# Patient Record
Sex: Female | Born: 1980 | Race: White | Hispanic: No | Marital: Single | Smoking: Current every day smoker
Health system: Southern US, Community
[De-identification: ages and names within clinical notes are randomized; demographics above are authoritative.]

## PROBLEM LIST (undated history)

## (undated) DIAGNOSIS — F329 Major depressive disorder, single episode, unspecified: Secondary | ICD-10-CM

## (undated) DIAGNOSIS — F32A Depression, unspecified: Secondary | ICD-10-CM

## (undated) DIAGNOSIS — K8020 Calculus of gallbladder without cholecystitis without obstruction: Secondary | ICD-10-CM

## (undated) HISTORY — PX: TONSILLECTOMY: SUR1361

## (undated) HISTORY — PX: TUBAL LIGATION: SHX77

---
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
## 1998-04-24 ENCOUNTER — Encounter: Admission: RE | Admit: 1998-04-24 | Discharge: 1998-04-24 | Payer: Self-pay | Admitting: Family Medicine

## 1998-05-01 ENCOUNTER — Encounter: Admission: RE | Admit: 1998-05-01 | Discharge: 1998-05-01 | Payer: Self-pay | Admitting: Family Medicine

## 1998-05-23 ENCOUNTER — Ambulatory Visit (HOSPITAL_COMMUNITY): Admission: RE | Admit: 1998-05-23 | Discharge: 1998-05-23 | Payer: Self-pay | Admitting: Sports Medicine

## 1998-05-29 ENCOUNTER — Encounter: Admission: RE | Admit: 1998-05-29 | Discharge: 1998-05-29 | Payer: Self-pay | Admitting: Sports Medicine

## 1998-06-29 ENCOUNTER — Encounter: Admission: RE | Admit: 1998-06-29 | Discharge: 1998-06-29 | Payer: Self-pay | Admitting: Family Medicine

## 1998-07-24 ENCOUNTER — Encounter: Admission: RE | Admit: 1998-07-24 | Discharge: 1998-07-24 | Payer: Self-pay | Admitting: Family Medicine

## 1998-08-06 ENCOUNTER — Encounter: Admission: RE | Admit: 1998-08-06 | Discharge: 1998-08-06 | Payer: Self-pay | Admitting: Family Medicine

## 1998-08-23 ENCOUNTER — Encounter: Admission: RE | Admit: 1998-08-23 | Discharge: 1998-08-23 | Payer: Self-pay | Admitting: Family Medicine

## 1998-09-06 ENCOUNTER — Encounter: Admission: RE | Admit: 1998-09-06 | Discharge: 1998-09-06 | Payer: Self-pay | Admitting: Family Medicine

## 1998-09-11 ENCOUNTER — Encounter: Admission: RE | Admit: 1998-09-11 | Discharge: 1998-09-11 | Payer: Self-pay | Admitting: Sports Medicine

## 1998-09-20 ENCOUNTER — Encounter: Admission: RE | Admit: 1998-09-20 | Discharge: 1998-09-20 | Payer: Self-pay | Admitting: Family Medicine

## 1998-09-27 ENCOUNTER — Encounter: Admission: RE | Admit: 1998-09-27 | Discharge: 1998-09-27 | Payer: Self-pay | Admitting: Family Medicine

## 1998-09-29 ENCOUNTER — Inpatient Hospital Stay (HOSPITAL_COMMUNITY): Admission: AD | Admit: 1998-09-29 | Discharge: 1998-10-01 | Payer: Self-pay | Admitting: *Deleted

## 1998-10-04 ENCOUNTER — Encounter: Admission: RE | Admit: 1998-10-04 | Discharge: 1998-10-04 | Payer: Self-pay | Admitting: Family Medicine

## 1998-11-29 ENCOUNTER — Other Ambulatory Visit: Admission: RE | Admit: 1998-11-29 | Discharge: 1998-11-29 | Payer: Self-pay | Admitting: *Deleted

## 1998-12-28 ENCOUNTER — Encounter: Admission: RE | Admit: 1998-12-28 | Discharge: 1998-12-28 | Payer: Self-pay | Admitting: Family Medicine

## 2004-02-06 ENCOUNTER — Encounter: Admission: RE | Admit: 2004-02-06 | Discharge: 2004-02-06 | Payer: Self-pay | Admitting: Family Medicine

## 2004-02-13 ENCOUNTER — Encounter: Admission: RE | Admit: 2004-02-13 | Discharge: 2004-02-13 | Payer: Self-pay | Admitting: Family Medicine

## 2004-02-15 ENCOUNTER — Ambulatory Visit (HOSPITAL_COMMUNITY): Admission: RE | Admit: 2004-02-15 | Discharge: 2004-02-15 | Payer: Self-pay

## 2004-03-12 ENCOUNTER — Encounter: Admission: RE | Admit: 2004-03-12 | Discharge: 2004-03-12 | Payer: Self-pay | Admitting: Family Medicine

## 2004-03-15 ENCOUNTER — Ambulatory Visit (HOSPITAL_COMMUNITY): Admission: RE | Admit: 2004-03-15 | Discharge: 2004-03-15 | Payer: Self-pay | Admitting: Family Medicine

## 2004-03-20 ENCOUNTER — Encounter: Admission: RE | Admit: 2004-03-20 | Discharge: 2004-03-20 | Payer: Self-pay | Admitting: *Deleted

## 2004-03-28 ENCOUNTER — Encounter: Admission: RE | Admit: 2004-03-28 | Discharge: 2004-03-28 | Payer: Self-pay | Admitting: *Deleted

## 2004-04-10 ENCOUNTER — Ambulatory Visit (HOSPITAL_COMMUNITY): Admission: RE | Admit: 2004-04-10 | Discharge: 2004-04-10 | Payer: Self-pay | Admitting: *Deleted

## 2004-04-11 ENCOUNTER — Encounter: Admission: RE | Admit: 2004-04-11 | Discharge: 2004-04-11 | Payer: Self-pay | Admitting: *Deleted

## 2004-04-25 ENCOUNTER — Encounter: Admission: RE | Admit: 2004-04-25 | Discharge: 2004-04-25 | Payer: Self-pay | Admitting: Family Medicine

## 2004-05-08 ENCOUNTER — Ambulatory Visit (HOSPITAL_COMMUNITY): Admission: RE | Admit: 2004-05-08 | Discharge: 2004-05-08 | Payer: Self-pay | Admitting: Obstetrics and Gynecology

## 2004-05-09 ENCOUNTER — Encounter: Admission: RE | Admit: 2004-05-09 | Discharge: 2004-05-09 | Payer: Self-pay | Admitting: Family Medicine

## 2004-05-17 ENCOUNTER — Inpatient Hospital Stay (HOSPITAL_COMMUNITY): Admission: AD | Admit: 2004-05-17 | Discharge: 2004-05-17 | Payer: Self-pay | Admitting: Obstetrics & Gynecology

## 2004-05-23 ENCOUNTER — Encounter: Admission: RE | Admit: 2004-05-23 | Discharge: 2004-05-23 | Payer: Self-pay | Admitting: Family Medicine

## 2004-05-23 ENCOUNTER — Inpatient Hospital Stay (HOSPITAL_COMMUNITY): Admission: AD | Admit: 2004-05-23 | Discharge: 2004-05-23 | Payer: Self-pay | Admitting: *Deleted

## 2004-05-27 ENCOUNTER — Inpatient Hospital Stay (HOSPITAL_COMMUNITY): Admission: AD | Admit: 2004-05-27 | Discharge: 2004-05-30 | Payer: Self-pay | Admitting: *Deleted

## 2009-11-26 ENCOUNTER — Emergency Department: Payer: Self-pay | Admitting: Emergency Medicine

## 2011-01-05 ENCOUNTER — Encounter: Payer: Self-pay | Admitting: *Deleted

## 2011-02-08 ENCOUNTER — Emergency Department (HOSPITAL_COMMUNITY)
Admission: EM | Admit: 2011-02-08 | Discharge: 2011-02-08 | Disposition: A | Discharge status: Home / Self Care | Payer: Self-pay | Attending: Emergency Medicine | Admitting: Emergency Medicine

## 2011-02-08 DIAGNOSIS — T169XXA Foreign body in ear, unspecified ear, initial encounter: Secondary | ICD-10-CM | POA: Insufficient documentation

## 2011-02-08 DIAGNOSIS — IMO0002 Reserved for concepts with insufficient information to code with codable children: Secondary | ICD-10-CM | POA: Insufficient documentation

## 2011-02-08 DIAGNOSIS — H9209 Otalgia, unspecified ear: Secondary | ICD-10-CM | POA: Insufficient documentation

## 2012-06-09 ENCOUNTER — Encounter (HOSPITAL_COMMUNITY): Payer: Self-pay | Admitting: *Deleted

## 2012-06-09 ENCOUNTER — Emergency Department (HOSPITAL_COMMUNITY): Payer: Self-pay

## 2012-06-09 ENCOUNTER — Emergency Department (HOSPITAL_COMMUNITY)
Admission: EM | Admit: 2012-06-09 | Discharge: 2012-06-09 | Disposition: A | Discharge status: Home / Self Care | Payer: Self-pay | Attending: Emergency Medicine | Admitting: Emergency Medicine

## 2012-06-09 DIAGNOSIS — K8020 Calculus of gallbladder without cholecystitis without obstruction: Secondary | ICD-10-CM | POA: Insufficient documentation

## 2012-06-09 DIAGNOSIS — R1011 Right upper quadrant pain: Secondary | ICD-10-CM | POA: Insufficient documentation

## 2012-06-09 DIAGNOSIS — M549 Dorsalgia, unspecified: Secondary | ICD-10-CM | POA: Insufficient documentation

## 2012-06-09 DIAGNOSIS — R0602 Shortness of breath: Secondary | ICD-10-CM | POA: Insufficient documentation

## 2012-06-09 LAB — COMPREHENSIVE METABOLIC PANEL
ALT: 10 U/L (ref 0–35)
AST: 13 U/L (ref 0–37)
Albumin: 4.1 g/dL (ref 3.5–5.2)
Alkaline Phosphatase: 54 U/L (ref 39–117)
CO2: 26 mEq/L (ref 19–32)
Chloride: 102 mEq/L (ref 96–112)
Creatinine, Ser: 0.82 mg/dL (ref 0.50–1.10)
GFR calc non Af Amer: >90 mL/min (ref >90)
Potassium: 4 mEq/L (ref 3.5–5.1)
Sodium: 139 mEq/L (ref 135–145)
Total Bilirubin: 0.3 mg/dL (ref 0.3–1.2)

## 2012-06-09 LAB — CBC
MCV: 94.2 fL (ref 78.0–100.0)
Platelets: 284 10*3/uL (ref 150–400)
RBC: 4.63 MIL/uL (ref 3.87–5.11)
RDW: 13.1 % (ref 11.5–15.5)
WBC: 11 10*3/uL — ABNORMAL HIGH (ref 4.0–10.5)

## 2012-06-09 LAB — URINALYSIS, ROUTINE W REFLEX MICROSCOPIC
Bilirubin Urine: NEGATIVE
Glucose, UA: NEGATIVE mg/dL
Hgb urine dipstick: NEGATIVE
Protein, ur: NEGATIVE mg/dL
Urobilinogen, UA: 1 mg/dL (ref 0.0–1.0)

## 2012-06-09 LAB — POCT PREGNANCY, URINE: Preg Test, Ur: NEGATIVE

## 2012-06-09 LAB — URINE MICROSCOPIC-ADD ON

## 2012-06-09 MED ORDER — ONDANSETRON HCL 4 MG/2ML IJ SOLN
4.0000 mg | Freq: Once | INTRAMUSCULAR | Status: AC
  Administered 2012-06-09: 4 mg via INTRAVENOUS
  Filled 2012-06-09: qty 2

## 2012-06-09 MED ORDER — HYDROCODONE-ACETAMINOPHEN 5-500 MG PO TABS: 1.0000 | ORAL_TABLET | Freq: Four times a day (QID) | ORAL | Status: AC | PRN

## 2012-06-09 MED ORDER — MORPHINE SULFATE 4 MG/ML IJ SOLN
4.0000 mg | Freq: Once | INTRAMUSCULAR | Status: AC
  Administered 2012-06-09: 4 mg via INTRAVENOUS
  Filled 2012-06-09: qty 1

## 2012-06-09 NOTE — ED Provider Notes (Addendum)
History     CSN: [id number]  Arrival date & time 06/09/12  1616   First MD Initiated Contact with Patient 06/09/12 1951      Chief Complaint  Patient presents with  . Abdominal Pain    (Consider location/radiation/quality/duration/timing/severity/associated sxs/prior treatment) Patient is a 31 y.o. female presenting with abdominal pain. The history is provided by the patient.  Abdominal Pain The primary symptoms of the illness include abdominal pain, shortness of breath and nausea. The primary symptoms of the illness do not include fever, vomiting, diarrhea, dysuria, vaginal discharge or vaginal bleeding. Episode onset: 3 days ago. The onset of the illness was gradual. The problem has been gradually worsening.  The abdominal pain is located in the RUQ. The abdominal pain radiates to the back (between shoulder blades and around the ribs). The severity of the abdominal pain is 8/10. The abdominal pain is relieved by nothing. Exacerbated by: certain positions.  The patient states that she believes she is currently not pregnant. The patient has not had a change in bowel habit. Additional symptoms associated with the illness include back pain. Symptoms associated with the illness do not include chills, anorexia, constipation, urgency, hematuria or frequency. Significant associated medical issues do not include PUD, GERD or gallstones.    History reviewed. No pertinent past medical history.  History reviewed. No pertinent past surgical history.  History reviewed. No pertinent family history.  History  Substance Use Topics  . Smoking status: Not on file  . Smokeless tobacco: Not on file  . Alcohol Use: No    OB History    Grav Para Term Preterm Abortions TAB SAB Ect Mult Living                  Review of Systems  Constitutional: Negative for fever and chills.  Respiratory: Positive for shortness of breath.   Gastrointestinal: Positive for nausea and abdominal pain. Negative for  vomiting, diarrhea, constipation and anorexia.  Genitourinary: Negative for dysuria, urgency, frequency, hematuria, vaginal bleeding and vaginal discharge.  Musculoskeletal: Positive for back pain.  All other systems reviewed and are negative.    Allergies  Review of patient's allergies indicates no known allergies.  Home Medications  No current outpatient prescriptions on file.  BP 120/89  Pulse 78  Temp 98.2 F (36.8 C) (Oral)  Resp 20  SpO2 100%  LMP 05/26/2012  Physical Exam  Nursing note and vitals reviewed. Constitutional: She is oriented to person, place, and time. She appears well-developed and well-nourished. No distress.  HENT:  Head: Normocephalic and atraumatic.  Mouth/Throat: Oropharynx is clear and moist.  Eyes: Conjunctivae and EOM are normal. Pupils are equal, round, and reactive to light.  Neck: Normal range of motion. Neck supple.  Cardiovascular: Normal rate, regular rhythm and intact distal pulses.   No murmur heard. Pulmonary/Chest: Effort normal and breath sounds normal. No respiratory distress. She has no wheezes. She has no rales. She exhibits no tenderness.  Abdominal: Soft. She exhibits no distension. There is tenderness in the right upper quadrant. There is guarding and positive Murphy's sign. There is no rebound and no CVA tenderness. No hernia.  Musculoskeletal: Normal range of motion. She exhibits no edema and no tenderness.       Thoracic back: Normal.  Neurological: She is alert and oriented to person, place, and time.  Skin: Skin is warm and dry. No rash noted. No erythema.  Psychiatric: She has a normal mood and affect. Her behavior is normal.  ED Course  Procedures (including critical care time)  Labs Reviewed  CBC - Abnormal; Notable for the following:    WBC 11.0 (*)     All other components within normal limits  URINALYSIS, ROUTINE W REFLEX MICROSCOPIC - Abnormal; Notable for the following:    APPearance CLOUDY (*)      Leukocytes, UA SMALL (*)     All other components within normal limits  URINE MICROSCOPIC-ADD ON - Abnormal; Notable for the following:    Squamous Epithelial / LPF MANY (*)     All other components within normal limits  COMPREHENSIVE METABOLIC PANEL  POCT PREGNANCY, URINE   Dg Chest 2 View  06/09/2012  *RADIOLOGY REPORT*  Clinical Data: Rib pain  CHEST - 2 VIEW  Comparison: None.  Findings: Normal heart size.  Clear lungs.   Epicardial fat at the right pericardiophrenic angle is noted.  No pneumothorax.  No pleural effusion.  IMPRESSION: No active cardiopulmonary disease.  Original Report Authenticated By: Donavan Burnet, M.D.   US Abdomen Complete  06/09/2012  *RADIOLOGY REPORT*  Clinical Data:  Right upper quadrant pain.  COMPLETE ABDOMINAL ULTRASOUND  Comparison:  None.  Findings:  Gallbladder:  The gallbladder is contracted and filled with multiple echogenic stones forming the wall echo shadow complex. This appearance can be associated with acute cholecystitis in the appropriate clinical setting.  However, no obvious gallbladder wall thickening or edema is demonstrated in the Murphy's sign is negative.  Clinical correlation is recommended.  Common bile duct:  Normal caliber with measured diameter of 4.3 mm.  Liver:  No focal lesion identified.  Within normal limits in parenchymal echogenicity.  IVC:  Appears normal.  Pancreas:  The pancreas is mostly obscured by overlying bowel gas. Visualized portions are unremarkable.  Spleen:  Spleen length measures 6.9 cm.  Normal homogeneous parenchymal echotexture.  Right Kidney:  The right kidney measures 10.7 cm length.  No hydronephrosis.  Left Kidney:  The left kidney measures 11.7 cm length.  No hydronephrosis.  Abdominal aorta:  No aneurysm.  IMPRESSION: Contracted and stone filled gallbladder.  Examination is otherwise unremarkable.  Original Report Authenticated By: Marlon Pel, M.D.     1. Gallstone       MDM   Patient with atypical  shoulder blade pain but also pronounced right upper quadrant pain that started 3 days ago and has persistently worsened. She denies any lower abdominal symptoms or urinary tract infection symptoms. She is afebrile here but has a leukocytosis of 11,000 and a UA is contaminated but otherwise no evidence of infection. CMP is within normal limits. Will get U/S of abd to eval gallbladder and CXR to evaluate the ribs and back.  PERC neg and mild cough but no other resp sx.  9:54 PM [place] positive for multiple gallstones.  But pain is controlled and will have her f/u with surgery      Gwyneth Sprout, MD 06/09/12 2154  Gwyneth Sprout, MD 06/09/12 7829  Gwyneth Sprout, MD 06/09/12 2254

## 2012-06-09 NOTE — ED Notes (Signed)
Pt reports pain around abd and into her rib area, having diarrhea, denies vomiting. Denies urinary symptoms.

## 2012-06-09 NOTE — ED Notes (Signed)
Patient with history of abdominal pain since Monday, just under ribcage around body, like a band around her.  Patient states that the pain has been getting worse since Monday.  Patient states that she did have some nausea today with the pain.

## 2012-06-09 NOTE — Discharge Instructions (Signed)
Gallbladder Disease You have gallbladder disease. This means that there are stones and/or inflammation in your gallbladder and bile duct system. The symptoms of this disease are: heartburn, nausea, belching, vomiting, and intolerance to certain foods (fatty foods mainly). Exact diagnosis of this condition requires ultrasound or special x-ray examination. Gallbladder symptoms may improve with a proper low-fat diet and weight loss (if you are overweight). Medicines to relieve pain and reduce spasms in the bile duct may be quite helpful. Usually the diseased gallbladder needs to be removed by surgery.  SEEK IMMEDIATE MEDICAL CARE IF:  You have more severe or persistent pain that lasts for more than one day. The pain would most likely be in the upper right part of the abdomen.   You develop a fever, repeated vomiting, or jaundice (yellow skin and eyes).  Document Released: 01/08/2005 Document Revised: 08/13/2011 Document Reviewed: 10/19/2009 [employer] Patient Information 2012 [employer]. 

## 2012-06-21 ENCOUNTER — Encounter (HOSPITAL_COMMUNITY): Payer: Self-pay | Admitting: *Deleted

## 2012-06-21 ENCOUNTER — Emergency Department (HOSPITAL_COMMUNITY)
Admission: EM | Admit: 2012-06-21 | Discharge: 2012-06-22 | Disposition: A | Discharge status: Home / Self Care | Payer: Self-pay | Attending: Emergency Medicine | Admitting: Emergency Medicine

## 2012-06-21 DIAGNOSIS — R109 Unspecified abdominal pain: Secondary | ICD-10-CM | POA: Insufficient documentation

## 2012-06-21 DIAGNOSIS — K8020 Calculus of gallbladder without cholecystitis without obstruction: Secondary | ICD-10-CM | POA: Insufficient documentation

## 2012-06-21 HISTORY — DX: Calculus of gallbladder without cholecystitis without obstruction: K80.20

## 2012-06-21 LAB — CBC WITH DIFFERENTIAL/PLATELET
Basophils Absolute: 0 10*3/uL (ref 0.0–0.1)
Basophils Relative: 0 % (ref 0–1)
Hemoglobin: 14.1 g/dL (ref 12.0–15.0)
Lymphocytes Relative: 31 % (ref 12–46)
MCHC: 34.6 g/dL (ref 30.0–36.0)
Monocytes Relative: 7 % (ref 3–12)
Neutro Abs: 6.6 10*3/uL (ref 1.7–7.7)
Neutrophils Relative %: 61 % (ref 43–77)
RDW: 12.9 % (ref 11.5–15.5)
WBC: 10.7 10*3/uL — ABNORMAL HIGH (ref 4.0–10.5)

## 2012-06-21 LAB — COMPREHENSIVE METABOLIC PANEL
AST: 14 U/L (ref 0–37)
Albumin: 3.8 g/dL (ref 3.5–5.2)
Alkaline Phosphatase: 64 U/L (ref 39–117)
CO2: 27 mEq/L (ref 19–32)
Chloride: 104 mEq/L (ref 96–112)
GFR calc non Af Amer: >90 mL/min (ref >90)
Potassium: 3.5 mEq/L (ref 3.5–5.1)
Total Bilirubin: 0.2 mg/dL — ABNORMAL LOW (ref 0.3–1.2)

## 2012-06-21 LAB — URINALYSIS, ROUTINE W REFLEX MICROSCOPIC
Bilirubin Urine: NEGATIVE
Glucose, UA: NEGATIVE mg/dL
Hgb urine dipstick: NEGATIVE
Ketones, ur: NEGATIVE mg/dL
pH: 6 (ref 5.0–8.0)

## 2012-06-21 LAB — PREGNANCY, URINE: Preg Test, Ur: NEGATIVE

## 2012-06-21 MED ORDER — ONDANSETRON 4 MG PO TBDP
4.0000 mg | ORAL_TABLET | Freq: Once | ORAL | Status: AC
  Administered 2012-06-21: 4 mg via ORAL
  Filled 2012-06-21: qty 1

## 2012-06-21 MED ORDER — HYDROCODONE-ACETAMINOPHEN 5-325 MG PO TABS: 1.0000 | ORAL_TABLET | Freq: Once | ORAL | Status: DC

## 2012-06-21 MED ORDER — HYDROCODONE-ACETAMINOPHEN 5-325 MG PO TABS
2.0000 | ORAL_TABLET | Freq: Once | ORAL | Status: AC
  Administered 2012-06-21: 2 via ORAL
  Filled 2012-06-21: qty 2

## 2012-06-21 NOTE — ED Provider Notes (Signed)
History     CSN: [id number]  Arrival date & time 06/21/12  1929   First MD Initiated Contact with Patient 06/21/12 2115      Chief Complaint  Patient presents with  . Abdominal Pain    (Consider location/radiation/quality/duration/timing/severity/associated sxs/prior treatment) Patient is a 31 y.o. female presenting with abdominal pain. The history is provided by the patient. No language interpreter was used.  Abdominal Pain The primary symptoms of the illness include abdominal pain and nausea. The primary symptoms of the illness do not include fever, shortness of breath, vomiting, diarrhea or vaginal discharge. The current episode started 13 to 24 hours ago. The onset of the illness was gradual. The problem has been gradually worsening.  The patient has not had a change in bowel habit. Symptoms associated with the illness do not include diaphoresis. Significant associated medical issues include gallstones. Significant associated medical issues do not include GERD, inflammatory bowel disease, diabetes, liver disease, substance abuse, HIV or cardiac disease.   RUQ pain intermittant since 6/26 with u/s showing gallstones but no cholecystitis.  States she is waiting for her medicaid to kick in to have surgery.  Ran out of pain meds yesterday and needs more medication. Has been eating and and drinking ok.  States that she bends and twists at work a lot and thinks that is what made the pain worse.  Nausea but no vomiting.  No other complaints.    Past Medical History  Diagnosis Date  . Gallstones     History reviewed. No pertinent past surgical history.  No family history on file.  History  Substance Use Topics  . Smoking status: Current Everyday Smoker  . Smokeless tobacco: Not on file  . Alcohol Use: No    OB History    Grav Para Term Preterm Abortions TAB SAB Ect Mult Living                  Review of Systems  Constitutional: Negative.  Negative for fever and diaphoresis.    HENT: Negative.   Eyes: Negative.   Respiratory: Negative.  Negative for shortness of breath.   Cardiovascular: Negative.  Negative for leg swelling.  Gastrointestinal: Positive for nausea and abdominal pain. Negative for vomiting, diarrhea and blood in stool.  Genitourinary: Negative for vaginal discharge.  Neurological: Negative.  Negative for dizziness, weakness and light-headedness.  Psychiatric/Behavioral: Negative.   All other systems reviewed and are negative.    Allergies  Review of patient's allergies indicates no known allergies.  Home Medications  No current outpatient prescriptions on file.  BP 103/72  Pulse 85  Temp 98.1 F (36.7 C) (Oral)  Resp 16  Ht 5\' 5"  (1.651 m)  Wt 198 lb (89.812 kg)  BMI 32.95 kg/m2  SpO2 98%  LMP 06/21/2012  Physical Exam  Nursing note and vitals reviewed. Constitutional: She is oriented to person, place, and time. She appears well-developed and well-nourished.  HENT:  Head: Normocephalic and atraumatic.  Eyes: Conjunctivae and EOM are normal. Pupils are equal, round, and reactive to light.  Neck: Normal range of motion. Neck supple.  Cardiovascular: Normal rate.   Pulmonary/Chest: Effort normal.  Abdominal: Soft. Bowel sounds are normal. She exhibits no distension and no mass. There is tenderness. There is no rebound and no guarding.  Musculoskeletal: Normal range of motion. She exhibits no edema and no tenderness.  Neurological: She is alert and oriented to person, place, and time. She has normal reflexes.  Skin: Skin is warm and dry.  Psychiatric: She has a normal mood and affect.    ED Course  Procedures (including critical care time)  Labs Reviewed  CBC WITH DIFFERENTIAL - Abnormal; Notable for the following:    WBC 10.7 (*)     All other components within normal limits  COMPREHENSIVE METABOLIC PANEL - Abnormal; Notable for the following:    Total Bilirubin 0.2 (*)     All other components within normal limits   LIPASE, BLOOD  URINALYSIS, ROUTINE W REFLEX MICROSCOPIC  PREGNANCY, URINE   No results found.   No diagnosis found.    MDM  RUQ pain with + gallstones pending surgery.  Out of pain meds.  No fever, elevated wbc,  liver enzymes or lipase.  Better after pain meds.  RX for vicodin.  Will see surgeon pending medicaid.  Ready for discharge.         Remi Haggard, NP 06/22/12 1218

## 2012-06-21 NOTE — ED Notes (Signed)
She was diagnosed with gallstones less than 2 weeks ago and she ran out of pain pills. She has not been able to follow up with a surgeon  Because she has no insurance.  Some nv.  lmpnow

## 2012-06-22 MED ORDER — HYDROCODONE-ACETAMINOPHEN 5-500 MG PO TABS: 1.0000 | ORAL_TABLET | Freq: Four times a day (QID) | ORAL | Status: AC | PRN

## 2012-06-22 MED ORDER — ONDANSETRON HCL 4 MG PO TABS: 4.0000 mg | ORAL_TABLET | Freq: Four times a day (QID) | ORAL | Status: AC

## 2012-06-22 NOTE — ED Notes (Signed)
The patient is AOx4 and comfortable with her discharge instructions.  The patient has a ride home. 

## 2012-06-24 NOTE — ED Provider Notes (Signed)
Medical screening examination/treatment/procedure(s) were performed by non-physician practitioner and as supervising physician I was immediately available for consultation/collaboration.  Royelle Hinchman K Linker, MD 06/24/12 0705 

## 2012-09-03 ENCOUNTER — Emergency Department (HOSPITAL_COMMUNITY): Payer: Self-pay

## 2012-09-03 ENCOUNTER — Encounter (HOSPITAL_COMMUNITY): Payer: Self-pay | Admitting: Emergency Medicine

## 2012-09-03 ENCOUNTER — Emergency Department (HOSPITAL_COMMUNITY)
Admission: EM | Admit: 2012-09-03 | Discharge: 2012-09-03 | Disposition: A | Discharge status: Home / Self Care | Payer: Self-pay | Attending: Emergency Medicine | Admitting: Emergency Medicine

## 2012-09-03 DIAGNOSIS — F17200 Nicotine dependence, unspecified, uncomplicated: Secondary | ICD-10-CM | POA: Insufficient documentation

## 2012-09-03 DIAGNOSIS — K8020 Calculus of gallbladder without cholecystitis without obstruction: Secondary | ICD-10-CM | POA: Insufficient documentation

## 2012-09-03 DIAGNOSIS — D72829 Elevated white blood cell count, unspecified: Secondary | ICD-10-CM | POA: Insufficient documentation

## 2012-09-03 DIAGNOSIS — R109 Unspecified abdominal pain: Secondary | ICD-10-CM

## 2012-09-03 LAB — CBC WITH DIFFERENTIAL/PLATELET
Hemoglobin: 13.8 g/dL (ref 12.0–15.0)
Lymphocytes Relative: 33 % (ref 12–46)
Lymphs Abs: 3.7 10*3/uL (ref 0.7–4.0)
Neutro Abs: 6.7 10*3/uL (ref 1.7–7.7)
Neutrophils Relative %: 60 % (ref 43–77)
Platelets: 271 10*3/uL (ref 150–400)
RBC: 4.3 MIL/uL (ref 3.87–5.11)
WBC: 11.1 10*3/uL — ABNORMAL HIGH (ref 4.0–10.5)

## 2012-09-03 LAB — URINALYSIS, ROUTINE W REFLEX MICROSCOPIC
Bilirubin Urine: NEGATIVE
Ketones, ur: NEGATIVE mg/dL
Nitrite: NEGATIVE
Protein, ur: NEGATIVE mg/dL
pH: 5.5 (ref 5.0–8.0)

## 2012-09-03 LAB — COMPREHENSIVE METABOLIC PANEL
BUN: 15 mg/dL (ref 6–23)
CO2: 26 mEq/L (ref 19–32)
Calcium: 9.3 mg/dL (ref 8.4–10.5)
Chloride: 103 mEq/L (ref 96–112)
Creatinine, Ser: 0.65 mg/dL (ref 0.50–1.10)
GFR calc non Af Amer: >90 mL/min (ref >90)
Total Bilirubin: 0.3 mg/dL (ref 0.3–1.2)

## 2012-09-03 MED ORDER — FAMOTIDINE 20 MG PO TABS: 20.0000 mg | ORAL_TABLET | Freq: Two times a day (BID) | ORAL | Status: DC

## 2012-09-03 MED ORDER — FAMOTIDINE 20 MG PO TABS
20.0000 mg | ORAL_TABLET | Freq: Once | ORAL | Status: AC
Start: 2012-09-03 — End: 2012-09-03
  Administered 2012-09-03: 20 mg via ORAL
  Filled 2012-09-03: qty 1

## 2012-09-03 MED ORDER — KETOROLAC TROMETHAMINE 60 MG/2ML IM SOLN
60.0000 mg | Freq: Once | INTRAMUSCULAR | Status: AC
  Administered 2012-09-03: 60 mg via INTRAMUSCULAR
  Filled 2012-09-03: qty 2

## 2012-09-03 MED ORDER — HYDROCODONE-ACETAMINOPHEN 5-500 MG PO TABS: 1.0000 | ORAL_TABLET | Freq: Four times a day (QID) | ORAL | Status: DC | PRN

## 2012-09-03 MED ORDER — GI COCKTAIL ~~LOC~~
30.0000 mL | Freq: Once | ORAL | Status: AC
  Administered 2012-09-03: 30 mL via ORAL
  Filled 2012-09-03: qty 30

## 2012-09-03 MED ORDER — ONDANSETRON HCL 4 MG PO TABS: 4.0000 mg | ORAL_TABLET | Freq: Four times a day (QID) | ORAL | Status: DC

## 2012-09-03 MED ORDER — PANTOPRAZOLE SODIUM 20 MG PO TBEC: 20.0000 mg | DELAYED_RELEASE_TABLET | Freq: Every day | ORAL | Status: DC

## 2012-09-03 MED ORDER — POTASSIUM CHLORIDE CRYS ER 20 MEQ PO TBCR
20.0000 meq | EXTENDED_RELEASE_TABLET | Freq: Once | ORAL | Status: AC
  Administered 2012-09-03: 20 meq via ORAL
  Filled 2012-09-03: qty 1

## 2012-09-03 MED ORDER — ONDANSETRON 4 MG PO TBDP
8.0000 mg | ORAL_TABLET | Freq: Once | ORAL | Status: AC
  Administered 2012-09-03: 8 mg via ORAL
  Filled 2012-09-03: qty 2

## 2012-09-03 NOTE — ED Provider Notes (Signed)
History     CSN: [id number]  Arrival date & time 09/03/12  0054   First MD Initiated Contact with Patient 09/03/12 0142      Chief Complaint  Patient presents with  . Abdominal Pain    (Consider location/radiation/quality/duration/timing/severity/associated sxs/prior treatment) HPI Hx per PT. Dx with gallstones a few months ago.  Since then has not seen a Careers adviser and doing OK until tonight.  Epigastric and RUQ pain radiates to her back, started at 11pm, ate dinner around 7pm, salad and pizza.  Took tylenol without relief, having nausea, emesis x 3-4, NB/NB.  Now with persistent pain and heartburn. Previous tubal ligation, no surgeries otherwise. No F/C. No shoulder pain. Mod in severity. Past Medical History  Diagnosis Date  . Gallstones     Past Surgical History  Procedure Date  . Tonsillectomy   . Tubal ligation     No family history on file.  History  Substance Use Topics  . Smoking status: Current Every Day Smoker  . Smokeless tobacco: Not on file  . Alcohol Use: No    OB History    Grav Para Term Preterm Abortions TAB SAB Ect Mult Living                  Review of Systems  Constitutional: Negative for fever and chills.  HENT: Negative for neck pain and neck stiffness.   Eyes: Negative for pain.  Respiratory: Negative for shortness of breath.   Cardiovascular: Negative for chest pain.  Gastrointestinal: Positive for abdominal pain. Negative for diarrhea, constipation and blood in stool.  Genitourinary: Negative for dysuria.  Musculoskeletal: Negative for back pain.  Skin: Negative for rash.  Neurological: Negative for headaches.  All other systems reviewed and are negative.    Allergies  Review of patient's allergies indicates no known allergies.  Home Medications  No current outpatient prescriptions on file.  BP 129/83  Pulse 86  Temp 97.9 F (36.6 C) (Oral)  Resp 18  SpO2 99%  LMP 08/19/2012  Physical Exam  Constitutional: She is oriented  to person, place, and time. She appears well-developed and well-nourished.  HENT:  Head: Normocephalic and atraumatic.  Eyes: Conjunctivae normal and EOM are normal. Pupils are equal, round, and reactive to light.  Neck: Trachea normal. Neck supple. No thyromegaly present.  Cardiovascular: Normal rate, regular rhythm, S1 normal, S2 normal and normal pulses.     No systolic murmur is present   No diastolic murmur is present  Pulses:      Radial pulses are 2+ on the right side, and 2+ on the left side.  Pulmonary/Chest: Effort normal and breath sounds normal. She has no wheezes. She has no rhonchi. She has no rales. She exhibits no tenderness.  Abdominal: Soft. Normal appearance and bowel sounds are normal. There is no CVA tenderness and negative Murphy's sign.       Epigastric tenderness and mild RUQ tenderness. No acute ABD or tenderness otherwise  Musculoskeletal:       BLE:s Calves nontender, no cords or erythema, negative Homans sign  Neurological: She is alert and oriented to person, place, and time. She has normal strength. No cranial nerve deficit or sensory deficit. GCS eye subscore is 4. GCS verbal subscore is 5. GCS motor subscore is 6.  Skin: Skin is warm and dry. No rash noted. She is not diaphoretic.  Psychiatric: Her speech is normal.       Cooperative and appropriate    ED Course  Procedures (  including critical care time)  toradol and zofran provided with min relief.   GI cocktail and pepcid with recheck at 3:18 AM feeling much better with pain resolved. PT attributes this to GI cocktail.     Results for orders placed during the hospital encounter of 09/03/12  URINALYSIS, ROUTINE W REFLEX MICROSCOPIC      Component Value Range   Color, Urine YELLOW  YELLOW   APPearance CLEAR  CLEAR   Specific Gravity, Urine 1.028  1.005 - 1.030   pH 5.5  5.0 - 8.0   Glucose, UA NEGATIVE  NEGATIVE mg/dL   Hgb urine dipstick NEGATIVE  NEGATIVE   Bilirubin Urine NEGATIVE  NEGATIVE    Ketones, ur NEGATIVE  NEGATIVE mg/dL   Protein, ur NEGATIVE  NEGATIVE mg/dL   Urobilinogen, UA 1.0  0.0 - 1.0 mg/dL   Nitrite NEGATIVE  NEGATIVE   Leukocytes, UA NEGATIVE  NEGATIVE  COMPREHENSIVE METABOLIC PANEL      Component Value Range   Sodium 139  135 - 145 mEq/L   Potassium 3.2 (*) 3.5 - 5.1 mEq/L   Chloride 103  96 - 112 mEq/L   CO2 26  19 - 32 mEq/L   Glucose, Bld 111 (*) 70 - 99 mg/dL   BUN 15  6 - 23 mg/dL   Creatinine, Ser 9.60  0.50 - 1.10 mg/dL   Calcium 9.3  8.4 - 45.4 mg/dL   Total Protein 7.1  6.0 - 8.3 g/dL   Albumin 3.7  3.5 - 5.2 g/dL   AST 12  0 - 37 U/L   ALT 10  0 - 35 U/L   Alkaline Phosphatase 54  39 - 117 U/L   Total Bilirubin 0.3  0.3 - 1.2 mg/dL   GFR calc non Af Amer >90  >90 mL/min   GFR calc Af Amer >90  >90 mL/min  CBC WITH DIFFERENTIAL      Component Value Range   WBC 11.1 (*) 4.0 - 10.5 K/uL   RBC 4.30  3.87 - 5.11 MIL/uL   Hemoglobin 13.8  12.0 - 15.0 g/dL   HCT 09.8  11.9 - 14.7 %   MCV 94.2  78.0 - 100.0 fL   MCH 32.1  26.0 - 34.0 pg   MCHC 34.1  30.0 - 36.0 g/dL   RDW 82.9  56.2 - 13.0 %   Platelets 271  150 - 400 K/uL   Neutrophils Relative 60  43 - 77 %   Neutro Abs 6.7  1.7 - 7.7 K/uL   Lymphocytes Relative 33  12 - 46 %   Lymphs Abs 3.7  0.7 - 4.0 K/uL   Monocytes Relative 6  3 - 12 %   Monocytes Absolute 0.6  0.1 - 1.0 K/uL   Eosinophils Relative 2  0 - 5 %   Eosinophils Absolute 0.2  0.0 - 0.7 K/uL   Basophils Relative 0  0 - 1 %   Basophils Absolute 0.0  0.0 - 0.1 K/uL   US Abdomen Complete  09/03/2012  *RADIOLOGY REPORT*  Clinical Data:  Abdominal pain.  COMPLETE ABDOMINAL ULTRASOUND  Comparison:  Abdominal ultrasound 06/09/2012.  Findings:  Gallbladder:  As seen on the comparison study, the gallbladder is filled with stones.  No gallbladder wall thickening or pericholecystic fluid is identified.  Sonographer reports negative Murphy's sign.  Common bile duct:  Measures 0.4 cm.  Liver:  No focal lesion identified.  Within  normal limits in parenchymal echogenicity.  IVC:  Appears  normal.  Pancreas:  No focal abnormality seen.  Spleen:  Measures 8.4 cm and appears normal.  Right Kidney:  Measures 11.8 cm and appears normal.  Left Kidney:  Measures 12.1 cm and appears normal.  Abdominal aorta:  No aneurysm identified.  IMPRESSION: As seen on the prior study, the gallbladder is filled with small stones.  No ultrasound evidence of acute cholecystitis is identified.   Original Report Authenticated By: Bernadene Bell. D'ALESSIO, M.D.    4:37 AM remains pain free.  No cholecystitis by [place]. Has mild leukocytosis with presentation that suggests GERD more so than GB.    [facility] referral provided with strict return precautions. Rx pepcid/ protonix/ zofran and vicodin as needed.   Strict return precautions verbalized as understood.    MDM   VS, nursing notes and old records reviewed.   Improved with medications provided.  Labs and imaging reviewed as above.        Sunnie Nielsen, MD 09/03/12 [phone]

## 2012-09-03 NOTE — ED Notes (Signed)
PT. REPORTS UPPER ABDOMINAL PAIN WITH VOMITTING ONSET THIS EVENING , DENIES DIARRHEA ,FEVER OR CHILLS. NO DYSURIA OR VAGINAL DISCHARGE. PT. STATES HISTORY OF GALLSTONES.

## 2012-09-03 NOTE — ED Notes (Signed)
Patient transported to Ultrasound 

## 2012-09-03 NOTE — ED Notes (Signed)
Pt came in for abdominal pain. She states that she has been experiencing the pain for a couple of months now, however at about 2300 yesterday she began vomiting (x4). Pt states that the pain starts in the upper abdominal region and radiates to lateral R. Pt states that she was diagnosed with gallstones about 2 months ago. Pt states that she was told to f/u with a doctor, however when she called the MD, because she did not have insurance he would not treat her. Pt rates pain as 9/10.

## 2013-12-10 ENCOUNTER — Emergency Department (HOSPITAL_COMMUNITY)
Admission: EM | Admit: 2013-12-10 | Discharge: 2013-12-10 | Disposition: A | Discharge status: Home / Self Care | Payer: Self-pay | Attending: Emergency Medicine | Admitting: Emergency Medicine

## 2013-12-10 ENCOUNTER — Encounter (HOSPITAL_COMMUNITY): Payer: Self-pay | Admitting: Emergency Medicine

## 2013-12-10 ENCOUNTER — Emergency Department (HOSPITAL_COMMUNITY): Payer: Self-pay

## 2013-12-10 DIAGNOSIS — Z9851 Tubal ligation status: Secondary | ICD-10-CM | POA: Insufficient documentation

## 2013-12-10 DIAGNOSIS — Z3202 Encounter for pregnancy test, result negative: Secondary | ICD-10-CM | POA: Insufficient documentation

## 2013-12-10 DIAGNOSIS — K8020 Calculus of gallbladder without cholecystitis without obstruction: Secondary | ICD-10-CM | POA: Insufficient documentation

## 2013-12-10 DIAGNOSIS — K8050 Calculus of bile duct without cholangitis or cholecystitis without obstruction: Secondary | ICD-10-CM

## 2013-12-10 DIAGNOSIS — F17200 Nicotine dependence, unspecified, uncomplicated: Secondary | ICD-10-CM | POA: Insufficient documentation

## 2013-12-10 LAB — CBC WITH DIFFERENTIAL/PLATELET
Basophils Absolute: 0 10*3/uL (ref 0.0–0.1)
Basophils Relative: 0 % (ref 0–1)
Eosinophils Absolute: 0.1 10*3/uL (ref 0.0–0.7)
Eosinophils Relative: 1 % (ref 0–5)
HCT: 46.3 % — ABNORMAL HIGH (ref 36.0–46.0)
Hemoglobin: 16.1 g/dL — ABNORMAL HIGH (ref 12.0–15.0)
Lymphocytes Relative: 30 % (ref 12–46)
Lymphs Abs: 2.6 10*3/uL (ref 0.7–4.0)
MCH: 32.9 pg (ref 26.0–34.0)
MCHC: 34.8 g/dL (ref 30.0–36.0)
MCV: 94.5 fL (ref 78.0–100.0)
Monocytes Absolute: 0.3 10*3/uL (ref 0.1–1.0)
Monocytes Relative: 4 % (ref 3–12)
Neutro Abs: 5.5 10*3/uL (ref 1.7–7.7)
Neutrophils Relative %: 65 % (ref 43–77)
Platelets: 170 10*3/uL (ref 150–400)
RBC: 4.9 MIL/uL (ref 3.87–5.11)
RDW: 12.9 % (ref 11.5–15.5)
WBC: 8.5 10*3/uL (ref 4.0–10.5)

## 2013-12-10 LAB — COMPREHENSIVE METABOLIC PANEL
ALT: 9 U/L (ref 0–35)
AST: 17 U/L (ref 0–37)
Albumin: 4.1 g/dL (ref 3.5–5.2)
Alkaline Phosphatase: 61 U/L (ref 39–117)
BUN: 14 mg/dL (ref 6–23)
CO2: 22 mEq/L (ref 19–32)
Calcium: 9.1 mg/dL (ref 8.4–10.5)
Chloride: 103 mEq/L (ref 96–112)
Creatinine, Ser: 0.66 mg/dL (ref 0.50–1.10)
GFR calc Af Amer: >90 mL/min (ref >90)
GFR calc non Af Amer: >90 mL/min (ref >90)
Glucose, Bld: 85 mg/dL (ref 70–99)
Potassium: 4.2 mEq/L (ref 3.5–5.1)
Sodium: 137 mEq/L (ref 135–145)
Total Bilirubin: 0.2 mg/dL — ABNORMAL LOW (ref 0.3–1.2)
Total Protein: 7.8 g/dL (ref 6.0–8.3)

## 2013-12-10 LAB — URINALYSIS, ROUTINE W REFLEX MICROSCOPIC
Bilirubin Urine: NEGATIVE
Glucose, UA: NEGATIVE mg/dL
Hgb urine dipstick: NEGATIVE
Ketones, ur: NEGATIVE mg/dL
Leukocytes, UA: NEGATIVE
Nitrite: NEGATIVE
Protein, ur: NEGATIVE mg/dL
Specific Gravity, Urine: 1.024 (ref 1.005–1.030)
Urobilinogen, UA: 1 mg/dL (ref 0.0–1.0)
pH: 6.5 (ref 5.0–8.0)

## 2013-12-10 LAB — PREGNANCY, URINE: Preg Test, Ur: NEGATIVE

## 2013-12-10 LAB — LIPASE, BLOOD: Lipase: 22 U/L (ref 11–59)

## 2013-12-10 MED ORDER — FENTANYL CITRATE 0.05 MG/ML IJ SOLN
100.0000 ug | Freq: Once | INTRAMUSCULAR | Status: AC
  Administered 2013-12-10: 100 ug via INTRAVENOUS
  Filled 2013-12-10: qty 2

## 2013-12-10 MED ORDER — PROMETHAZINE HCL 25 MG PO TABS: 25.0000 mg | ORAL_TABLET | Freq: Three times a day (TID) | ORAL | Status: DC | PRN

## 2013-12-10 MED ORDER — HYDROCODONE-ACETAMINOPHEN 5-325 MG PO TABS: 1.0000 | ORAL_TABLET | Freq: Four times a day (QID) | ORAL | Status: DC | PRN

## 2013-12-10 MED ORDER — ONDANSETRON HCL 4 MG/2ML IJ SOLN
4.0000 mg | Freq: Once | INTRAMUSCULAR | Status: AC
  Administered 2013-12-10: 4 mg via INTRAVENOUS
  Filled 2013-12-10: qty 2

## 2013-12-10 MED ORDER — SODIUM CHLORIDE 0.9 % IV BOLUS (SEPSIS)
1000.0000 mL | Freq: Once | INTRAVENOUS | Status: AC
  Administered 2013-12-10: 1000 mL via INTRAVENOUS

## 2013-12-10 NOTE — ED Provider Notes (Signed)
CSN: [id number]     Arrival date & time 12/10/13  1112 History   First MD Initiated Contact with Patient 12/10/13 1131     Chief Complaint  Patient presents with  . Abdominal Pain   (Consider location/radiation/quality/duration/timing/severity/associated sxs/prior Treatment) HPI Patient presents emergency department with right upper quadrant abdominal pain, with nausea and vomiting.  Patient, states, that she's had a history of gallstones.  She states she did not followup with anybody after her last visit to the hospital.  Patient, states this morning started developing pain, with nausea and vomiting.  Patient denies chest pain, shortness of breath, fever, diarrhea, weakness, dizziness, back pain, dysuria, or syncope.  Patient, states she did not take any medications prior to arrival Past Medical History  Diagnosis Date  . Gallstones    Past Surgical History  Procedure Laterality Date  . Tonsillectomy    . Tubal ligation     History reviewed. No pertinent family history. History  Substance Use Topics  . Smoking status: Current Every Day Smoker  . Smokeless tobacco: Not on file  . Alcohol Use: No   OB History   Grav Para Term Preterm Abortions TAB SAB Ect Mult Living                 Review of Systems All other systems negative except as documented in the HPI. All pertinent positives and negatives as reviewed in the HPI. Allergies  Review of patient's allergies indicates no known allergies.  Home Medications   Current Outpatient Rx  Name  Route  Sig  Dispense  Refill  . acetaminophen (TYLENOL) 500 MG tablet   Oral   Take 1,000 mg by mouth every 6 (six) hours as needed for mild pain, moderate pain or headache.          BP 138/81  Pulse 86  Temp(Src) 97.7 F (36.5 C) (Oral)  Resp 16  Ht 5\' 5"  (1.651 m)  Wt 220 lb (99.791 kg)  BMI 36.61 kg/m2  SpO2 100%  LMP 11/16/2013 Physical Exam  Nursing note and vitals reviewed. Constitutional: She is oriented to person,  place, and time. She appears well-developed and well-nourished. No distress.  HENT:  Head: Normocephalic and atraumatic.  Mouth/Throat: Oropharynx is clear and moist.  Eyes: Pupils are equal, round, and reactive to light.  Neck: Normal range of motion. Neck supple.  Cardiovascular: Normal rate, regular rhythm and normal heart sounds.  Exam reveals no gallop and no friction rub.   No murmur heard. Pulmonary/Chest: Effort normal and breath sounds normal. No respiratory distress.  Abdominal: Normal appearance and bowel sounds are normal. She exhibits no distension. There is tenderness in the right upper quadrant. There is no rigidity, no rebound and no guarding.    Neurological: She is alert and oriented to person, place, and time.  Skin: Skin is warm and dry. No rash noted. No erythema.    ED Course  Procedures (including critical care time) Labs Review Labs Reviewed  LIPASE, BLOOD  CBC WITH DIFFERENTIAL  COMPREHENSIVE METABOLIC PANEL  URINALYSIS, ROUTINE W REFLEX MICROSCOPIC  PREGNANCY, URINE   Patient has gallstones, again noted on ultrasound.  The patient does not have any Murphy's sign on exam.  Patient does not have fever and is not currently vomiting, felt the patient probably will be able to be discharged to follow up with general surgery.  Patient is advised on diet, to help prevent any further worsening in her condition.  She is advised to return here as  needed    Carlyle Dolly, PA-C 12/10/13 1512

## 2013-12-10 NOTE — ED Provider Notes (Signed)
Medical screening examination/treatment/procedure(s) were performed by non-physician practitioner and as supervising physician I was immediately available for consultation/collaboration.  EKG Interpretation   None        Shon Baton, MD 12/10/13 1910

## 2013-12-10 NOTE — ED Notes (Signed)
She c/o upper abd. pai wrapping around to her back, plus few episodes of n/v this morning.  She cites this exact symptomology about a year ago, at which time it was discovered that her "gallbladder is full of gallstones".  She was advised to undergo cholecystectomy at that time, but for monetary concerns, has not done so.  She is in no distress; and her family (husband and two children) are with her.

## 2013-12-10 NOTE — ED Notes (Signed)
Pt aware of need of urine sample. Will provide when able

## 2015-10-04 ENCOUNTER — Encounter: Payer: Self-pay | Admitting: Emergency Medicine

## 2015-10-04 ENCOUNTER — Emergency Department
Admission: EM | Admit: 2015-10-04 | Discharge: 2015-10-04 | Disposition: A | Discharge status: Home / Self Care | Payer: Self-pay | Attending: Emergency Medicine | Admitting: Emergency Medicine

## 2015-10-04 DIAGNOSIS — Z720 Tobacco use: Secondary | ICD-10-CM | POA: Insufficient documentation

## 2015-10-04 DIAGNOSIS — N939 Abnormal uterine and vaginal bleeding, unspecified: Secondary | ICD-10-CM

## 2015-10-04 DIAGNOSIS — Z3202 Encounter for pregnancy test, result negative: Secondary | ICD-10-CM | POA: Insufficient documentation

## 2015-10-04 DIAGNOSIS — M545 Low back pain: Secondary | ICD-10-CM | POA: Insufficient documentation

## 2015-10-04 LAB — CBC WITH DIFFERENTIAL/PLATELET
Basophils Absolute: 0.1 10*3/uL (ref 0–0.1)
Basophils Relative: 1 %
EOS ABS: 0.1 10*3/uL (ref 0–0.7)
Eosinophils Relative: 1 %
HCT: 45.9 % (ref 35.0–47.0)
HEMOGLOBIN: 15.6 g/dL (ref 12.0–16.0)
LYMPHS ABS: 3.6 10*3/uL (ref 1.0–3.6)
LYMPHS PCT: 31 %
MCH: 31.6 pg (ref 26.0–34.0)
MCHC: 34 g/dL (ref 32.0–36.0)
MCV: 92.7 fL (ref 80.0–100.0)
MONOS PCT: 5 %
Monocytes Absolute: 0.6 10*3/uL (ref 0.2–0.9)
NEUTROS PCT: 62 %
Neutro Abs: 7.1 10*3/uL — ABNORMAL HIGH (ref 1.4–6.5)
Platelets: 284 10*3/uL (ref 150–440)
RBC: 4.95 MIL/uL (ref 3.80–5.20)
RDW: 12.8 % (ref 11.5–14.5)
WBC: 11.4 10*3/uL — ABNORMAL HIGH (ref 3.6–11.0)

## 2015-10-04 LAB — POCT PREGNANCY, URINE: Preg Test, Ur: NEGATIVE

## 2015-10-04 MED ORDER — MEDROXYPROGESTERONE ACETATE 10 MG PO TABS: 10.0000 mg | ORAL_TABLET | Freq: Every day | ORAL | Status: DC

## 2015-10-04 MED ORDER — MEDROXYPROGESTERONE ACETATE 10 MG PO TABS
10.0000 mg | ORAL_TABLET | Freq: Every day | ORAL | Status: DC
  Filled 2015-10-04: qty 1

## 2015-10-04 NOTE — ED Provider Notes (Signed)
[facility] Emergency Department Provider Note     Time seen: ----------------------------------------- 11:47 AM on 10/04/2015 -----------------------------------------    I have reviewed the triage vital signs and the nursing notes.   HISTORY  Chief Complaint Vaginal Bleeding    HPI Michele Novak is a 34 y.o. female who presents ER for heavy vaginal bleeding. She notes she's had for menstrual periods last 2 months. She's not had a history of this before. She has felt weak but denies any other major symptoms. She has had vaginal clotting with heavy bleeding. Typically she bleeds for 5 days and then she regularly would have another cycle of months later. She's never had irregular menses before. She has had some light abdominal cramping and low back pain otherwise denies any pain.   Past Medical History  Diagnosis Date  . Gallstones     There are no active problems to display for this patient.   Past Surgical History  Procedure Laterality Date  . Tonsillectomy    . Tubal ligation      Allergies Review of patient's allergies indicates no known allergies.  Social History Social History  Substance Use Topics  . Smoking status: Current Every Day Smoker  . Smokeless tobacco: None  . Alcohol Use: No    Review of Systems Constitutional: Negative for fever. Eyes: Negative for visual changes. ENT: Negative for sore throat. Cardiovascular: Negative for chest pain. Respiratory: Negative for shortness of breath. Gastrointestinal: Negative for abdominal pain, vomiting and diarrhea. Genitourinary: Negative for dysuria. Positive for vaginal bleeding Musculoskeletal: Positive for low back pain Skin: Negative for rash. Neurological: Negative for headaches, focal weakness or numbness.  10-point ROS otherwise negative.  ____________________________________________   PHYSICAL EXAM:  VITAL SIGNS: ED Triage Vitals  Enc Vitals Group     BP  10/04/15 1023 116/69 mmHg     Pulse Rate 10/04/15 1023 81     Resp 10/04/15 1023 16     Temp 10/04/15 1023 98 F (36.7 C)     Temp Source 10/04/15 1023 Oral     SpO2 10/04/15 1023 98 %     Weight 10/04/15 1023 238 lb (107.956 kg)     Height 10/04/15 1023  (1.651 m)     Head Cir --      Peak Flow --      Pain Score 10/04/15 1017 5     Pain Loc --      Pain Edu? --      Excl. in GC? --     Constitutional: Alert and oriented. Well appearing and in no distress. Eyes: Conjunctivae are normal. PERRL. Normal extraocular movements. ENT   Head: Normocephalic and atraumatic.   Nose: No congestion/rhinnorhea.   Mouth/Throat: Mucous membranes are moist.   Neck: No stridor. Cardiovascular: Normal rate, regular rhythm. Normal and symmetric distal pulses are present in all extremities. No murmurs, rubs, or gallops. Respiratory: Normal respiratory effort without tachypnea nor retractions. Breath sounds are clear and equal bilaterally. No wheezes/rales/rhonchi. Gastrointestinal: Soft and nontender. No distention. No abdominal bruits.  Musculoskeletal: Nontender with normal range of motion in all extremities. No joint effusions.  No lower extremity tenderness nor edema. Neurologic:  Normal speech and language. No gross focal neurologic deficits are appreciated. Speech is normal. No gait instability. Skin:  Skin is warm, dry and intact. No rash noted. Psychiatric: Mood and affect are normal. Speech and behavior are normal. Patient exhibits appropriate insight and judgment.  ____________________________________________  ED COURSE:  Pertinent labs &  imaging results that were available during my care of the patient were reviewed by me and considered in my medical decision making (see chart for details). We'll check basic labs, likely start on hormones to improve the bleeding. ____________________________________________    LABS (pertinent positives/negatives)  Labs Reviewed   CBC WITH DIFFERENTIAL/PLATELET - Abnormal; Notable for the following:    WBC 11.4 (*)    Neutro Abs 7.1 (*)    All other components within normal limits  BASIC METABOLIC PANEL  POC URINE PREG, ED  POCT PREGNANCY, URINE   ____________________________________________  FINAL ASSESSMENT AND PLAN  Abnormal vaginal bleeding  Plan: Patient with labs and imaging as dictated above. Patient being started on Provera. Her labs are unremarkable at this time. She will be referred to GYN for outpatient follow-up.   Emily FilbertWilliams, Jadine Brumley E, MD   Emily FilbertJonathan E Rudie Rikard, MD 10/04/15 [phone]

## 2015-10-04 NOTE — Discharge Instructions (Signed)

## 2015-10-04 NOTE — ED Notes (Signed)
Pt discharged awaiting provera tablet from pharmacy

## 2015-10-04 NOTE — ED Notes (Signed)
4 periods in 2 months.

## 2015-10-04 NOTE — ED Notes (Signed)
Unable to get the green tube of blood - dr aware. H&h normal - hold on green tube

## 2016-05-09 ENCOUNTER — Encounter: Payer: Self-pay | Admitting: Obstetrics and Gynecology

## 2016-05-09 ENCOUNTER — Ambulatory Visit (INDEPENDENT_AMBULATORY_CARE_PROVIDER_SITE_OTHER): Payer: Self-pay | Admitting: Obstetrics and Gynecology

## 2016-05-09 VITALS — BP 115/76 | HR 83 | Ht 65.0 in | Wt 244.3 lb

## 2016-05-09 DIAGNOSIS — J069 Acute upper respiratory infection, unspecified: Secondary | ICD-10-CM

## 2016-05-09 DIAGNOSIS — Z7689 Persons encountering health services in other specified circumstances: Secondary | ICD-10-CM

## 2016-05-09 DIAGNOSIS — Z7189 Other specified counseling: Secondary | ICD-10-CM

## 2016-05-09 DIAGNOSIS — K8020 Calculus of gallbladder without cholecystitis without obstruction: Secondary | ICD-10-CM

## 2016-05-09 DIAGNOSIS — N926 Irregular menstruation, unspecified: Secondary | ICD-10-CM

## 2016-05-09 MED ORDER — ALBUTEROL SULFATE HFA 108 (90 BASE) MCG/ACT IN AERS: 2.0000 | INHALATION_SPRAY | Freq: Four times a day (QID) | RESPIRATORY_TRACT | Status: DC | PRN

## 2016-05-09 MED ORDER — CEFDINIR 300 MG PO CAPS: 300.0000 mg | ORAL_CAPSULE | Freq: Two times a day (BID) | ORAL | Status: DC

## 2016-05-09 NOTE — Progress Notes (Signed)
Subjective:   Michele Novak is a 35 y.o. 682P0 Caucasian female here for establishing and physical.  Patient's last menstrual period was 04/28/2016.   Reports menses have been occuring every two weeks for the last 6 months, but otherwise normal since onset. Current complaints: fatigue, epigastric colic secondary to known gallstones; productive cough x 2 days with green phlegm and low grade fever PCP: none       Does need &  desire labs  Social History: Sexual: heterosexual Marital Status: single Living situation: with children Occupation: binder on assembly line Tobacco/alcohol: no alcohol use Illicit drugs: no history of illicit drug use  The following portions of the patient's history were reviewed and updated as appropriate: allergies, current medications, past family history, past medical history, past social history, past surgical history and problem list.  Past Medical History Past Medical History  Diagnosis Date  . Gallstones     Past Surgical History Past Surgical History  Procedure Laterality Date  . Tonsillectomy    . Tubal ligation      Gynecologic History G2P0  Patient's last menstrual period was 04/28/2016. Contraception: none Last Pap: 2005. Results were: normal  Obstetric History OB History  Gravida Para Term Preterm AB SAB TAB Ectopic Multiple Living  2        1 3     # Outcome Date GA Lbr Len/2nd Weight Sex Delivery Anes PTL Lv  2A Gravida 2005    F Vag-Spont   Y  2B Gravida 2005    F Vag-Spont   Y  1 Gravida 1999    M Vag-Spont   Y      Current Medications Current Outpatient Prescriptions on File Prior to Visit  Medication Sig Dispense Refill  . acetaminophen (TYLENOL) 500 MG tablet Take 1,000 mg by mouth every 6 (six) hours as needed for mild pain, moderate pain or headache.    . medroxyPROGESTERone (PROVERA) 10 MG tablet Take 1 tablet (10 mg total) by mouth daily. (Patient not taking: Reported on 05/09/2016) 10 tablet 0   No current  facility-administered medications on file prior to visit.    Review of Systems Patient denies any headaches, blurred vision, shortness of breath, chest pain, abdominal pain, problems with bowel movements, urination, or intercourse.  Objective:  BP 115/76 mmHg  Pulse 83  Ht 5\' 5"  (1.651 m)  Wt 244 lb 4.8 oz (110.814 kg)  BMI 40.65 kg/m2  LMP 04/28/2016 Physical Exam  General:  Well developed, well nourished, no acute distress. She is alert and oriented x3. Skin:  Warm and dry Neck:  Midline trachea, no thyromegaly or nodules Cardiovascular: Regular rate and rhythm, no murmur heard Lungs:  Effort normal, all lung fields clear with mild wheezing bilaterally Breasts:  No dominant palpable mass, retraction, or nipple discharge Abdomen:  Soft, non tender, no hepatosplenomegaly or masses, mild epigastric pain on palpation Pelvic:  External genitalia is normal in appearance.  The vagina is normal in appearance. The cervix is bulbous, no CMT.  Thin prep pap is not done. Uterus is felt to be normal size, shape, and contour.  No adnexal masses or tenderness noted. Extremities:  No swelling or varicosities noted Psych:  She has a normal mood and affect  Assessment:   Healthy well-woman exam Obesity URI Gallstones Irregular menses fatigue  Plan:  Labs obtained[facility] Info given for [facility]icaid  F/U 3 months for follow up, or sooner if needed   Michele Novak Suzan NailerN Delynn Olvera, CNM

## 2016-05-10 LAB — LIPID PANEL
CHOLESTEROL TOTAL: 178 mg/dL (ref 100–199)
Chol/HDL Ratio: 4 ratio units (ref 0.0–4.4)
HDL: 45 mg/dL (ref >39)
LDL Calculated: 103 mg/dL — ABNORMAL HIGH (ref 0–99)
Triglycerides: 150 mg/dL — ABNORMAL HIGH (ref 0–149)
VLDL CHOLESTEROL CAL: 30 mg/dL (ref 5–40)

## 2016-05-10 LAB — CBC
HEMATOCRIT: 44.8 % (ref 34.0–46.6)
Hemoglobin: 14.9 g/dL (ref 11.1–15.9)
MCH: 31.4 pg (ref 26.6–33.0)
MCHC: 33.3 g/dL (ref 31.5–35.7)
MCV: 95 fL (ref 79–97)
Platelets: 304 10*3/uL (ref 150–379)
RBC: 4.74 x10E6/uL (ref 3.77–5.28)
RDW: 13.3 % (ref 12.3–15.4)
WBC: 8.7 10*3/uL (ref 3.4–10.8)

## 2016-05-10 LAB — COMPREHENSIVE METABOLIC PANEL
ALT: 14 IU/L (ref 0–32)
AST: 15 IU/L (ref 0–40)
Albumin/Globulin Ratio: 1.3 (ref 1.2–2.2)
Albumin: 4.1 g/dL (ref 3.5–5.5)
Alkaline Phosphatase: 82 IU/L (ref 39–117)
BUN/Creatinine Ratio: 15 (ref 9–23)
BUN: 10 mg/dL (ref 6–20)
Bilirubin Total: 0.2 mg/dL (ref 0.0–1.2)
CALCIUM: 9.2 mg/dL (ref 8.7–10.2)
CO2: 21 mmol/L (ref 18–29)
CREATININE: 0.67 mg/dL (ref 0.57–1.00)
Chloride: 101 mmol/L (ref 96–106)
GFR calc Af Amer: 131 mL/min/{1.73_m2} (ref >59)
GFR, EST NON AFRICAN AMERICAN: 113 mL/min/{1.73_m2} (ref >59)
GLOBULIN, TOTAL: 3.1 g/dL (ref 1.5–4.5)
Glucose: 90 mg/dL (ref 65–99)
Potassium: 4.2 mmol/L (ref 3.5–5.2)
SODIUM: 140 mmol/L (ref 134–144)
TOTAL PROTEIN: 7.2 g/dL (ref 6.0–8.5)

## 2016-05-10 LAB — THYROID PANEL WITH TSH
Free Thyroxine Index: 2.3 (ref 1.2–4.9)
T3 UPTAKE RATIO: 26 % (ref 24–39)
T4, Total: 9 ug/dL (ref 4.5–12.0)
TSH: 1.49 u[IU]/mL (ref 0.450–4.500)

## 2016-05-10 LAB — FSH/LH
FSH: 4.9 m[IU]/mL
LH: 7.4 m[IU]/mL

## 2016-05-10 LAB — HEMOGLOBIN A1C
ESTIMATED AVERAGE GLUCOSE: 108 mg/dL
Hgb A1c MFr Bld: 5.4 % (ref 4.8–5.6)

## 2016-05-10 LAB — IRON: Iron: 36 ug/dL (ref 27–159)

## 2016-06-20 ENCOUNTER — Encounter: Payer: Self-pay | Admitting: Obstetrics and Gynecology

## 2016-06-20 ENCOUNTER — Ambulatory Visit (INDEPENDENT_AMBULATORY_CARE_PROVIDER_SITE_OTHER): Payer: Self-pay | Admitting: Obstetrics and Gynecology

## 2016-06-20 VITALS — BP 111/72 | HR 83 | Ht 65.0 in | Wt 246.7 lb

## 2016-06-20 DIAGNOSIS — F329 Major depressive disorder, single episode, unspecified: Secondary | ICD-10-CM

## 2016-06-20 DIAGNOSIS — F32A Depression, unspecified: Secondary | ICD-10-CM

## 2016-06-20 MED ORDER — FLUOXETINE HCL 10 MG PO CAPS: 10.0000 mg | ORAL_CAPSULE | Freq: Every day | ORAL | Status: DC

## 2016-06-20 MED ORDER — NORGESTIMATE-ETH ESTRADIOL 0.25-35 MG-MCG PO TABS: 1.0000 | ORAL_TABLET | Freq: Every day | ORAL | Status: DC

## 2016-06-25 ENCOUNTER — Ambulatory Visit: Payer: Self-pay | Attending: Internal Medicine | Admitting: *Deleted

## 2016-06-25 ENCOUNTER — Ambulatory Visit
Admission: RE | Admit: 2016-06-25 | Discharge: 2016-06-25 | Disposition: A | Discharge status: Home / Self Care | Payer: Self-pay | Source: Ambulatory Visit | Attending: Oncology | Admitting: Oncology

## 2016-06-25 ENCOUNTER — Encounter: Payer: Self-pay | Admitting: *Deleted

## 2016-06-25 ENCOUNTER — Other Ambulatory Visit: Payer: Self-pay

## 2016-06-25 VITALS — BP 123/84 | HR 88 | Temp 98.9°F | Resp 20

## 2016-06-25 DIAGNOSIS — Z0000 Encounter for general adult medical examination without abnormal findings: Secondary | ICD-10-CM | POA: Insufficient documentation

## 2016-06-25 DIAGNOSIS — N63 Unspecified lump in unspecified breast: Secondary | ICD-10-CM

## 2016-06-25 NOTE — Progress Notes (Signed)
Subjective:     Patient ID: Finis BudJennifer Basey, female   DOB: Mar 11, 1981, 35 y.o.   MRN: [id number]  HPI   Review of Systems     Objective:   Physical Exam  Pulmonary/Chest: Right breast exhibits no inverted nipple, no mass, no nipple discharge, no skin change and no tenderness. Left breast exhibits no inverted nipple, no mass, no nipple discharge, no skin change and no tenderness. Breasts are asymmetrical.    Right breast 1-2 cup sizes larger than the left  Abdominal: There is no splenomegaly or hepatomegaly.  Genitourinary: No labial fusion. There is no rash, tenderness, lesion or injury on the right labia. There is no rash, tenderness, lesion or injury on the left labia. No erythema or tenderness in the vagina. No foreign body around the vagina. No signs of injury around the vagina. Vaginal discharge found.    White non-odorous discharge noted       Assessment:     35 year old White female referred to [facility] by Harlow MaresMelody Shambley, CNM for pap smear.  Patient states she has not had a pap in about 12 years.  Clinical breast breast exam unremarkable.  Taught self breast awareness.  Specimen collected for pap smear without difficulty.  Info given on HPV.  Patient has been screened for eligibility.  She does not have any insurance, Medicare or Medicaid.  She also meets financial eligibility.  Hand-out given on the Affordable Care Act.     Plan:     [facility]  Specimen for pap sent to the lab.  Will follow-up per [facility] protocol.

## 2016-06-25 NOTE — Patient Instructions (Signed)
Human Papillomavirus Human papillomavirus (HPV) is the most common sexually transmitted infection (STI) and is highly contagious. HPV infections cause genital warts and cancers to the outlet of the womb (cervix), birth canal (vagina), opening of the birth canal (vulva), and anus. There are over 100 types of HPV. Unless wartlike lesions are present in the throat or there are genital warts that you can see or feel, HPV usually does not cause symptoms. It is possible to be infected for long periods and pass it on to others without knowing it. CAUSES  HPV is spread from person to person through sexual contact. This includes oral, vaginal, or anal sex. RISK FACTORS  Having unprotected sex. HPV can be spread by oral, vaginal, or anal sex.  Having several sex partners.  Having a sex partner who has other sex partners.  Having or having had another sexually transmitted infection. SIGNS AND SYMPTOMS  Most people carrying HPV do not have any symptoms. If symptoms are present, symptoms may include:  Wartlike lesions in the throat (from having oral sex).  Warts in the infected skin or mucous membranes.  Genital warts that may itch, burn, or bleed.  Genital warts that may be painful or bleed during sexual intercourse. DIAGNOSIS  If wartlike lesions are present in the throat or genital warts are present, your health care provider can usually diagnose HPV by physical examination.   Genital warts are easily seen with the naked eye.  Currently, there is no FDA-approved test to detect HPV in males.  In females, a Pap test can show cells that are infected with HPV.  In females, a scope can be used to view the cervix (colposcopy). A colposcopy can be performed if the pelvic exam or Pap test is abnormal. A sample of tissue may be removed (biopsy) during the colposcopy. TREATMENT  There is no treatment for the virus itself. However, there are treatments for the health problems and symptoms HPV can cause.  Your health care provider will follow you closely after you are treated. This is because the HPV can come back and may need treatment again. Treatment of HPV may include:   Medicines, which may be injected or applied in a cream, lotion, or gel form.  Use of a probe to apply extreme cold (cryotherapy).  Application of an intense beam of light (laser treatment).  Use of a probe to apply extreme heat (electrocautery).  Surgery. HOME CARE INSTRUCTIONS   Take medicines only as directed by your health care provider.  Use over-the-counter creams for itching or irritation as directed by your health care provider.  Keep all follow-up visits as directed by your health care provider. This is important.  Do not touch or scratch the warts.  Do not treat genital warts with medicines used for treating hand warts.  Do not have sex while you are being treated.  Do not douche or use tampons during treatment of HPV.  Tell your sex partner about your infection because he or she may also need treatment.  If you become pregnant, tell your health care provider that you have had HPV. Your health care provider will monitor you closely during pregnancy to be sure your baby is safe.  After treatment, use condoms during sex to prevent future infections.  Have only one sex partner.  Have a sex partner who does not have other sex partners. PREVENTION   Talk to your health care provider about getting the HPV vaccines. These vaccines prevent some HPV infections and cancers.   It is recommended that the vaccine be given to males and females between the ages of 9 and 26 years old. It will not work if you already have HPV, and it is not recommended for pregnant women.  A Pap test is done to screen for cervical cancer in women.  The first Pap test should be done at age 21 years.  Between ages 21 and 29 years, Pap tests are repeated every 2 years.  Beginning at age 30, you are advised to have a Pap test every  3 years as long as your past 3 Pap tests have been normal.  Some women have medical problems that increase the chance of getting cervical cancer. Talk to your health care provider about these problems. It is especially important to talk to your health care provider if a new problem develops soon after your last Pap test. In these cases, your health care provider may recommend more frequent screening and Pap tests.  The above recommendations are the same for women who have or have not gotten the vaccine for HPV.  If you had a hysterectomy for a problem that was not a cancer or a condition that could lead to cancer, then you no longer need Pap tests. However, even if you no longer need a Pap test, a regular exam is a good idea to make sure no other problems are starting.   If you are between the ages of 65 and 70 years and you have had normal Pap tests going back 10 years, you no longer need Pap tests. However, even if you no longer need a Pap test, a regular exam is a good idea to make sure no other problems are starting.  If you have had past treatment for cervical cancer or a condition that could lead to cancer, you need Pap tests and screening for cancer for at least 20 years after your treatment.  If Pap tests have been discontinued, risk factors (such as a new sexual partner)need to be reassessed to determine if screening should be resumed.  Some women may need screenings more often if they are at high risk for cervical cancer. SEEK MEDICAL CARE IF:   The treated skin becomes red, swollen, or painful.  You have a fever.  You feel generally ill.  You feel lumps or pimple-like projections in and around your genital area.  You develop bleeding of the vagina or the treatment area.  You have painful sexual intercourse. MAKE SURE YOU:   Understand these instructions.  Will watch your condition.  Will get help if you are not doing well or get worse.   This information is not  intended to replace advice given to you by your health care provider. Make sure you discuss any questions you have with your health care provider.   Document Released: 02/21/2004 Document Revised: 12/22/2014 Document Reviewed: 03/08/2014 Elsevier Interactive Patient Education 2016 Elsevier Inc[employer]   Gave patient hand-out, Women Staying Healthy, Active and Well from [facility], with education on breast health, pap smears, heart and colon health. 

## 2016-06-27 LAB — PAP LB AND HPV HIGH-RISK
HPV, HIGH-RISK: NEGATIVE
PAP Smear Comment: 0

## 2016-07-09 ENCOUNTER — Encounter: Payer: Self-pay | Admitting: Obstetrics and Gynecology

## 2016-07-09 NOTE — Progress Notes (Signed)
Subjective:     Patient ID: Michele Novak, female   DOB: 1981/11/23, 35 y.o.   MRN: [id number]  HPI Reports worsening depression over last few months, worse with hormonal changes.  Review of Systems See above Depression screen [facility] 2/9 07/09/2016  Decreased Interest 3  Down, Depressed, Hopeless 3  PHQ - 2 Score 6  Altered sleeping 1  Tired, decreased energy 2  Change in appetite 2  Feeling bad or failure about yourself  2  Trouble concentrating 1  Moving slowly or fidgety/restless 1  Suicidal thoughts 0  PHQ-9 Score 15      Objective:   Physical Exam A&O x4 Well groomed female, flat affect, tearful at times Vitals:   06/20/16 0816  Weight: 246 lb 11.2 oz (111.9 kg)  Height: 5\' 5"  (1.651 m)      Assessment:     depression    Plan:     To start SSRI- counseled on medication, side-effects, expected outcome and follow-up. To send me a message through MyChart in 3-5 weeks to let me know how she is doing on meds. RTC as needed.  Tayleigh Wetherell Randall, CNM

## 2016-07-11 ENCOUNTER — Ambulatory Visit: Payer: Self-pay

## 2016-07-11 ENCOUNTER — Other Ambulatory Visit: Payer: Self-pay

## 2016-07-29 ENCOUNTER — Other Ambulatory Visit: Payer: Self-pay | Admitting: Obstetrics and Gynecology

## 2016-07-29 ENCOUNTER — Ambulatory Visit
Admission: RE | Admit: 2016-07-29 | Discharge: 2016-07-29 | Disposition: A | Discharge status: Home / Self Care | Payer: Self-pay | Source: Ambulatory Visit | Attending: Internal Medicine | Admitting: Internal Medicine

## 2016-07-29 ENCOUNTER — Encounter: Payer: Self-pay | Admitting: Obstetrics and Gynecology

## 2016-07-29 DIAGNOSIS — N63 Unspecified lump in unspecified breast: Secondary | ICD-10-CM

## 2016-07-29 MED ORDER — FLUOXETINE HCL 20 MG PO CAPS
20.0000 mg | ORAL_CAPSULE | Freq: Every day | ORAL | 6 refills | Status: DC
Start: 2016-07-29 — End: 2018-10-18

## 2016-07-30 ENCOUNTER — Encounter: Payer: Self-pay | Admitting: *Deleted

## 2016-07-30 ENCOUNTER — Other Ambulatory Visit: Payer: Self-pay | Admitting: *Deleted

## 2016-07-30 DIAGNOSIS — N63 Unspecified lump in unspecified breast: Secondary | ICD-10-CM

## 2016-07-30 NOTE — Progress Notes (Signed)
Letter mailed to inform patient of her normal pap smear and need for 6 month follow-up mammogram.  Appointment for [facility]17 [facility] 2[facility]  Patient is to call if she has any questions or needs.  HSIS to [place].

## 2016-08-01 ENCOUNTER — Other Ambulatory Visit: Payer: Self-pay | Admitting: Obstetrics and Gynecology

## 2016-08-01 DIAGNOSIS — R41840 Attention and concentration deficit: Secondary | ICD-10-CM

## 2017-01-28 ENCOUNTER — Ambulatory Visit: Payer: Self-pay | Attending: Oncology

## 2017-01-29 ENCOUNTER — Ambulatory Visit
Admission: RE | Admit: 2017-01-29 | Discharge: 2017-01-29 | Disposition: A | Discharge status: Home / Self Care | Payer: Self-pay | Source: Ambulatory Visit | Attending: Oncology | Admitting: Oncology

## 2017-01-29 DIAGNOSIS — N63 Unspecified lump in unspecified breast: Secondary | ICD-10-CM

## 2017-02-06 ENCOUNTER — Encounter: Payer: Self-pay | Admitting: *Deleted

## 2017-02-06 NOTE — Progress Notes (Signed)
Letter mailed to inform patient of the need for her to return in 6 months for her next mammogram.  [facility] month [facility] on 07/29/17 @ 800.

## 2017-04-12 ENCOUNTER — Emergency Department: Payer: 59

## 2017-04-12 ENCOUNTER — Emergency Department
Admission: EM | Admit: 2017-04-12 | Discharge: 2017-04-12 | Disposition: A | Discharge status: Home / Self Care | Payer: 59 | Attending: Emergency Medicine | Admitting: Emergency Medicine

## 2017-04-12 DIAGNOSIS — Y9351 Activity, roller skating (inline) and skateboarding: Secondary | ICD-10-CM | POA: Diagnosis not present

## 2017-04-12 DIAGNOSIS — Y999 Unspecified external cause status: Secondary | ICD-10-CM | POA: Diagnosis not present

## 2017-04-12 DIAGNOSIS — S59901A Unspecified injury of right elbow, initial encounter: Secondary | ICD-10-CM | POA: Diagnosis present

## 2017-04-12 DIAGNOSIS — Y929 Unspecified place or not applicable: Secondary | ICD-10-CM | POA: Diagnosis not present

## 2017-04-12 DIAGNOSIS — S52121A Displaced fracture of head of right radius, initial encounter for closed fracture: Secondary | ICD-10-CM | POA: Diagnosis not present

## 2017-04-12 DIAGNOSIS — F17200 Nicotine dependence, unspecified, uncomplicated: Secondary | ICD-10-CM | POA: Diagnosis not present

## 2017-04-12 MED ORDER — OXYCODONE-ACETAMINOPHEN 5-325 MG PO TABS
1.0000 | ORAL_TABLET | Freq: Once | ORAL | Status: AC
  Administered 2017-04-12: 1 via ORAL
  Filled 2017-04-12: qty 1

## 2017-04-12 MED ORDER — MELOXICAM 15 MG PO TABS: 15.0000 mg | ORAL_TABLET | Freq: Every day | ORAL | 0 refills | Status: DC

## 2017-04-12 MED ORDER — HYDROCODONE-ACETAMINOPHEN 5-325 MG PO TABS: 1.0000 | ORAL_TABLET | ORAL | 0 refills | Status: DC | PRN

## 2017-04-12 NOTE — ED Triage Notes (Addendum)
Pt states that she was roller skating today and fell around 1600, pt states that she attempted to catch herself with her rt hand, pt states that she started having rt elbow pain that has gotten worse and is worse with supination and pronation of the rt hand. + cap refill to fingers within normal limits, + radial pulse, pt is bracing her rt arm against her body for support and comfort

## 2017-04-12 NOTE — ED Notes (Signed)
Patient states she was roller skating and fell forward onto her hands but is not reporting any problems with her hands.  Her right elbow is causing her 8/10 pain and she has ice on it and is immobilized.

## 2017-04-12 NOTE — ED Notes (Signed)
XR at bedside

## 2017-04-12 NOTE — ED Provider Notes (Signed)
[facility] Emergency Department Provider Note  ____________________________________________  Time seen: Approximately 9:03 PM  I have reviewed the triage vital signs and the nursing notes.   HISTORY  Chief Complaint Arm Injury    HPI Michele Novak is a 36 y.o. female who presents emergency department complaining of sharp right elbow pain status post fall. Patient reports that she was skating when she took a fall. She reports that she tried to catch herself with her right arm. She is now having sharp pain to the right elbow. Patient reports there is no pain to the hand or wrist. Patient denied directly fall on the elbow. She did not hit her head or lose consciousness. Patient was using arm before arrival but states that any supination or pronation drastically increase his pain. She does report some mild tingling to the first, second, third digit of the right hand. No numbness or complete loss of sensation. Full range of motion all digits right hand.   Past Medical History:  Diagnosis Date  . Gallstones     There are no active problems to display for this patient.   Past Surgical History:  Procedure Laterality Date  . TONSILLECTOMY    . TUBAL LIGATION      Prior to Admission medications   Medication Sig Start Date End Date Taking? Authorizing Provider  acetaminophen (TYLENOL) 500 MG tablet Take 1,000 mg by mouth every 6 (six) hours as needed for mild pain, moderate pain or headache.    Historical Provider, MD  albuterol (PROVENTIL HFA;VENTOLIN HFA) 108 (90 Base) MCG/ACT inhaler Inhale 2 puffs into the lungs every 6 (six) hours as needed for wheezing or shortness of breath. Patient not taking: Reported on 06/20/2016 05/09/16   Melody N Shambley, CNM  cefdinir (OMNICEF) 300 MG capsule Take 1 capsule (300 mg total) by mouth 2 (two) times daily. Patient not taking: Reported on 06/20/2016 05/09/16   Melody N Shambley, CNM  FLUoxetine (PROZAC) 20 MG capsule Take 1  capsule (20 mg total) by mouth daily. 07/29/16   Melody N Shambley, CNM  medroxyPROGESTERone (PROVERA) 10 MG tablet Take 1 tablet (10 mg total) by mouth daily. Patient not taking: Reported on 05/09/2016 10/04/15   Emily Filbert, MD  norgestimate-ethinyl estradiol (ORTHO-CYCLEN,SPRINTEC,PREVIFEM) 0.25-35 MG-MCG tablet Take 1 tablet by mouth daily. 06/20/16   Melody Suzan Nailer, CNM    Allergies Patient has no known allergies.  Family History  Problem Relation Age of Onset  . Cancer Father     lung, stomach  . Hypertension Maternal Grandmother   . Cancer Maternal Grandmother   . Cancer Paternal Grandmother     Social History Social History  Substance Use Topics  . Smoking status: Current Every Day Smoker  . Smokeless tobacco: Never Used  . Alcohol use No     Review of Systems  Constitutional: No fever/chills Eyes: No visual changes.  Cardiovascular: no chest pain. Respiratory: no cough. No SOB. Gastrointestinal: No abdominal pain.  No nausea, no vomiting.   Musculoskeletal: Positive for right elbow pain Skin: Negative for rash, abrasions, lacerations, ecchymosis. Neurological: Negative for headaches, focal weakness or numbness. 10-point ROS otherwise negative.  ____________________________________________   PHYSICAL EXAM:  VITAL SIGNS: ED Triage Vitals [04/12/17 1935]  Enc Vitals Group     BP 135/76     Pulse Rate 91     Resp 18     Temp 98.3 F (36.8 C)     Temp Source Oral     SpO2 100 %  Weight 240 lb (108.9 kg)     Height  (1.651 m)     Head Circumference      Peak Flow      Pain Score 6     Pain Loc      Pain Edu?      Excl. in GC?      Constitutional: Alert and oriented. Well appearing and in no acute distress. Eyes: Conjunctivae are normal. PERRL. EOMI. Head: Atraumatic. Neck: No stridor.    Cardiovascular: Normal rate, regular rhythm. Normal S1 and S2.  Good peripheral circulation. Respiratory: Normal respiratory effort without  tachypnea or retractions. Lungs CTAB. Good air entry to the bases with no decreased or absent breath sounds. Musculoskeletal: Limited range of motion to the right upper extremity due to pain. Due to the mechanism of injury, active and passive range of motion are not attempted prior to x-ray. Patient is exquisitely tender to palpation over the proximal radius. No palpable abnormality. No tenderness to palpation. Radial pulse intact. Sensation intact 5 digits. Patient is able to move all 5 digits appropriately. Neurologic:  Normal speech and language. No gross focal neurologic deficits are appreciated.  Skin:  Skin is warm, dry and intact. No rash noted. Psychiatric: Mood and affect are normal. Speech and behavior are normal. Patient exhibits appropriate insight and judgement.   ____________________________________________   LABS (all labs ordered are listed, but only abnormal results are displayed)  Labs Reviewed - No data to display ____________________________________________  EKG   ____________________________________________  RADIOLOGY Festus Barren Teal Bontrager, personally viewed and evaluated these images (plain radiographs) as part of my medical decision making, as well as reviewing the written report by the radiologist.  Dg Elbow Complete Right  Result Date: 04/12/2017 CLINICAL DATA:  Fall during skating, pain EXAM: RIGHT ELBOW - COMPLETE 3+ VIEW COMPARISON:  None. FINDINGS: Elbow effusion. Mildly depressed intra-articular fracture involving the radial head. No dislocation. IMPRESSION: Minimally offset intra-articular fracture involving the radial head with associated elbow effusion. Electronically Signed   By: Jasmine Pang M.D.   On: 04/12/2017 20:46    ____________________________________________    PROCEDURES  Procedure(s) performed:    .Splint Application Date/Time: 04/12/2017 9:06 PM Performed by: Gala Romney D Authorized by: Gala Romney D    Consent:    Consent obtained:  Verbal   Consent given by:  Patient   Risks discussed:  Pain and swelling Pre-procedure details:    Sensation:  Normal Procedure details:    Laterality:  Right   Location:  Elbow   Elbow:  R elbow   Splint type:  Long arm   Supplies:  Cotton padding, Ortho-Glass, elastic bandage and sling Post-procedure details:    Pain:  Improved   Sensation:  Normal   Patient tolerance of procedure:  Tolerated well, no immediate complications      Medications  oxyCODONE-acetaminophen (PERCOCET/ROXICET) 5-325 MG per tablet 1 tablet (1 tablet Oral Given 04/12/17 2107)     ____________________________________________   INITIAL IMPRESSION / ASSESSMENT AND PLAN / ED COURSE  Pertinent labs & imaging results that were available during my care of the patient were reviewed by me and considered in my medical decision making (see chart for details).  Review of the [place] CSRS was performed in accordance of the [facility] prior to dispensing any controlled drugs.     Patient's diagnosis is consistent with minimally displaced radial head fracture. This is Mason type II. Patient is vascularly and neurologically intact distal to injury site. No indication  for emergent referral or further imaging at this time. Patient's elbow was splinted in the emergency department as described above. Patient will be discharged home with prescriptions for anti-inflammatory and pain medication. Patient is to follow up with orthopedics for further management. Patient is given ED precautions to return to the ED for any worsening or new symptoms.     ____________________________________________  FINAL CLINICAL IMPRESSION(S) / ED DIAGNOSES  Final diagnoses:  Closed displaced fracture of head of right radius, initial encounter      NEW MEDICATIONS STARTED DURING THIS VISIT:  New Prescriptions   No medications on file        This chart was dictated using voice recognition  software/Dragon. Despite best efforts to proofread, errors can occur which can change the meaning. Any change was purely unintentionalRacheal Patchesuthriell, PA-C 04/12/17 2107    Minna Antis, MD 04/12/17 2300

## 2017-07-29 ENCOUNTER — Ambulatory Visit
Admission: RE | Admit: 2017-07-29 | Discharge: 2017-07-29 | Disposition: A | Discharge status: Home / Self Care | Payer: 59 | Source: Ambulatory Visit | Attending: Oncology | Admitting: Oncology

## 2017-07-29 ENCOUNTER — Ambulatory Visit: Payer: 59 | Attending: Oncology

## 2017-07-29 VITALS — BP 123/86 | HR 72 | Temp 98.9°F | Ht 65.0 in | Wt 258.0 lb

## 2017-07-29 DIAGNOSIS — N63 Unspecified lump in unspecified breast: Secondary | ICD-10-CM | POA: Insufficient documentation

## 2017-07-29 NOTE — Progress Notes (Signed)
Subjective:     Patient ID: Finis BudJennifer Xie, female   DOB: Aug 02, 1981, 36 y.o.   MRN: [id number]  HPI   Review of Systems     Objective:   Physical Exam  Pulmonary/Chest: Right breast exhibits no inverted nipple, no mass, no nipple discharge, no skin change and no tenderness. Left breast exhibits no inverted nipple, no mass, no nipple discharge, no skin change and no tenderness. Breasts are asymmetrical.  Left breast smaller than right breast       Assessment:     36 year old patient returns for 6 month follow-up left breast asymmetry, and annual mammogram.  Patient screened, and meets BCCCP eligibility.  Patient does not have insurance, Medicare or Medicaid.  Handout given on Affordable Care Act.Instructed patient on breast self-exam using teach back method.  CBE unremarkable. No mass or lump palpated.  Patient has 38 year old twins, and a 38 year old son.  Works at [employer] in [place]    Plan:     Sent for bilateral diagnostic mammogram, and left breast ultrasound.

## 2017-09-11 ENCOUNTER — Emergency Department
Admission: EM | Admit: 2017-09-11 | Discharge: 2017-09-11 | Disposition: A | Discharge status: Home / Self Care | Payer: Self-pay | Attending: Emergency Medicine | Admitting: Emergency Medicine

## 2017-09-11 ENCOUNTER — Emergency Department: Payer: Self-pay

## 2017-09-11 DIAGNOSIS — F17200 Nicotine dependence, unspecified, uncomplicated: Secondary | ICD-10-CM | POA: Insufficient documentation

## 2017-09-11 DIAGNOSIS — Z79899 Other long term (current) drug therapy: Secondary | ICD-10-CM | POA: Insufficient documentation

## 2017-09-11 DIAGNOSIS — R1012 Left upper quadrant pain: Secondary | ICD-10-CM | POA: Insufficient documentation

## 2017-09-11 LAB — URINALYSIS, COMPLETE (UACMP) WITH MICROSCOPIC
BACTERIA UA: NONE SEEN
BILIRUBIN URINE: NEGATIVE
Glucose, UA: NEGATIVE mg/dL
Hgb urine dipstick: NEGATIVE
KETONES UR: NEGATIVE mg/dL
Nitrite: NEGATIVE
PH: 7 (ref 5.0–8.0)
PROTEIN: NEGATIVE mg/dL
RBC / HPF: NONE SEEN RBC/hpf (ref 0–5)
Specific Gravity, Urine: 1.019 (ref 1.005–1.030)

## 2017-09-11 LAB — COMPREHENSIVE METABOLIC PANEL
ALT: 14 U/L (ref 14–54)
AST: 19 U/L (ref 15–41)
Albumin: 3.9 g/dL (ref 3.5–5.0)
Alkaline Phosphatase: 64 U/L (ref 38–126)
Anion gap: 8 (ref 5–15)
BILIRUBIN TOTAL: 0.4 mg/dL (ref 0.3–1.2)
BUN: 13 mg/dL (ref 6–20)
CO2: 25 mmol/L (ref 22–32)
CREATININE: 0.69 mg/dL (ref 0.44–1.00)
Calcium: 9 mg/dL (ref 8.9–10.3)
Chloride: 106 mmol/L (ref 101–111)
Glucose, Bld: 102 mg/dL — ABNORMAL HIGH (ref 65–99)
POTASSIUM: 4.1 mmol/L (ref 3.5–5.1)
SODIUM: 139 mmol/L (ref 135–145)
Total Protein: 7.7 g/dL (ref 6.5–8.1)

## 2017-09-11 LAB — CBC
HEMATOCRIT: 45 % (ref 35.0–47.0)
HEMOGLOBIN: 15.6 g/dL (ref 12.0–16.0)
MCH: 31.6 pg (ref 26.0–34.0)
MCHC: 34.7 g/dL (ref 32.0–36.0)
MCV: 91.2 fL (ref 80.0–100.0)
Platelets: 348 10*3/uL (ref 150–440)
RBC: 4.93 MIL/uL (ref 3.80–5.20)
RDW: 13.4 % (ref 11.5–14.5)
WBC: 11 10*3/uL (ref 3.6–11.0)

## 2017-09-11 LAB — POCT PREGNANCY, URINE: Preg Test, Ur: NEGATIVE

## 2017-09-11 LAB — LIPASE, BLOOD: Lipase: 21 U/L (ref 11–51)

## 2017-09-11 MED ORDER — ALUM & MAG HYDROXIDE-SIMETH 400-400-40 MG/5ML PO SUSP: 10.0000 mL | Freq: Four times a day (QID) | ORAL | 0 refills | Status: DC | PRN

## 2017-09-11 MED ORDER — OMEPRAZOLE 40 MG PO CPDR: 40.0000 mg | DELAYED_RELEASE_CAPSULE | Freq: Every day | ORAL | 0 refills | Status: DC

## 2017-09-11 MED ORDER — IOPAMIDOL (ISOVUE-300) INJECTION 61%
100.0000 mL | Freq: Once | INTRAVENOUS | Status: AC | PRN
  Administered 2017-09-11: 100 mL via INTRAVENOUS

## 2017-09-11 MED ORDER — FAMOTIDINE 20 MG PO TABS
20.0000 mg | ORAL_TABLET | Freq: Once | ORAL | Status: AC
  Administered 2017-09-11: 20 mg via ORAL
  Filled 2017-09-11: qty 1

## 2017-09-11 MED ORDER — IOPAMIDOL (ISOVUE-300) INJECTION 61%: 15.0000 mL | INTRAVENOUS | Status: DC

## 2017-09-11 MED ORDER — ALUM & MAG HYDROXIDE-SIMETH 200-200-20 MG/5ML PO SUSP
30.0000 mL | Freq: Once | ORAL | Status: AC
  Administered 2017-09-11: 30 mL via ORAL
  Filled 2017-09-11: qty 30

## 2017-09-11 MED ORDER — IOPAMIDOL (ISOVUE-300) INJECTION 61%
30.0000 mL | Freq: Once | INTRAVENOUS | Status: AC | PRN
  Administered 2017-09-11: 30 mL via ORAL

## 2017-09-11 NOTE — ED Provider Notes (Addendum)
[facility] Emergency Department Provider Note ____________________________________________   First MD Initiated Contact with Patient 09/11/17 0935     (approximate)  I have reviewed the triage vital signs and the nursing notes.   HISTORY  Chief Complaint Abdominal Pain    HPI Michele Novak is a 36 y.o. female history of cholelithiasis but no other PMH who presents with left upper quadrant pain acute onset the last day, not associated with nausea, vomiting, diarrhea, fever or chills, or urinary symptoms.  States it is worse with a deep breath or certain positions.  No prior history of this pain. Patient states that she has felt a mass in that area for the last several months, and she was also concerned that it could be related to her gallstones. Patient states that she has been unable to get a cholecystectomy due to not having insurance. She reports intermittent occasional pain on the right upper quadrant related to her gallstones but states this pain is different.  Past Medical History:  Diagnosis Date  . Gallstones     There are no active problems to display for this patient.   Past Surgical History:  Procedure Laterality Date  . TONSILLECTOMY    . TUBAL LIGATION      Prior to Admission medications   Medication Sig Start Date End Date Taking? Authorizing Provider  albuterol (PROVENTIL HFA;VENTOLIN HFA) 108 (90 Base) MCG/ACT inhaler Inhale 2 puffs into the lungs every 6 (six) hours as needed for wheezing or shortness of breath. Patient not taking: Reported on 06/20/2016 05/09/16   Shambley, Melody N, CNM  alum & mag hydroxide-simeth (MAALOX ADVANCED MAX ST) 400-400-40 MG/5ML suspension Take 10 mLs by mouth every 6 (six) hours as needed for indigestion. 09/11/17   Dionne Bucy, MD  cefdinir (OMNICEF) 300 MG capsule Take 1 capsule (300 mg total) by mouth 2 (two) times daily. Patient not taking: Reported on 06/20/2016 05/09/16   Purcell Nails, CNM    FLUoxetine (PROZAC) 20 MG capsule Take 1 capsule (20 mg total) by mouth daily. Patient not taking: Reported on 09/11/2017 07/29/16   Purcell Nails, CNM  HYDROcodone-acetaminophen (NORCO/VICODIN) 5-325 MG tablet Take 1 tablet by mouth every 4 (four) hours as needed for moderate pain. Patient not taking: Reported on 09/11/2017 04/12/17   Cuthriell, Delorise Royals, PA-C  medroxyPROGESTERone (PROVERA) 10 MG tablet Take 1 tablet (10 mg total) by mouth daily. Patient not taking: Reported on 05/09/2016 10/04/15   Emily Filbert, MD  meloxicam (MOBIC) 15 MG tablet Take 1 tablet (15 mg total) by mouth daily. Patient not taking: Reported on 09/11/2017 04/12/17   Cuthriell, Delorise Royals, PA-C  norgestimate-ethinyl estradiol (ORTHO-CYCLEN,SPRINTEC,PREVIFEM) 0.25-35 MG-MCG tablet Take 1 tablet by mouth daily. 06/20/16   Shambley, Melody N, CNM  omeprazole (PRILOSEC) 40 MG capsule Take 1 capsule (40 mg total) by mouth daily. 09/11/17 09/11/18  Dionne Bucy, MD    Allergies Patient has no known allergies.  Family History  Problem Relation Age of Onset  . Cancer Father        lung, stomach  . Hypertension Maternal Grandmother   . Cancer Maternal Grandmother   . Cancer Paternal Grandmother   . Breast cancer Neg Hx     Social History Social History  Substance Use Topics  . Smoking status: Current Every Day Smoker  . Smokeless tobacco: Never Used  . Alcohol use No    Review of Systems  Constitutional: No fever/chills Eyes: No visual changes. ENT: No sore throat.  Cardiovascular: Denies chest pain. Respiratory: Denies shortness of breath. Gastrointestinal: No nausea, no vomiting.  No diarrhea.  Genitourinary: Negative for dysuria.  Musculoskeletal: Negative for back pain. Skin: Negative for rash. Neurological: Negative for headaches, focal weakness or numbness.   ____________________________________________   PHYSICAL EXAM:  VITAL SIGNS: ED Triage Vitals  Enc Vitals Group     BP  09/11/17 0832 127/70     Pulse Rate 09/11/17 0832 96     Resp 09/11/17 0832 18     Temp 09/11/17 0832 98.8 F (37.1 C)     Temp Source 09/11/17 0832 Oral     SpO2 09/11/17 0832 97 %     Weight 09/11/17 0832 260 lb (117.9 kg)     Height 09/11/17 0832  (1.651 m)     Head Circumference --      Peak Flow --      Pain Score 09/11/17 0831 8     Pain Loc --      Pain Edu? --      Excl. in GC? --     Constitutional: Alert and oriented. Well appearing and in no acute distress. Eyes: Conjunctivae are normal.  No scleral icterus.  Head: Atraumatic. Nose: No congestion/rhinnorhea. Mouth/Throat: Mucous membranes are moist.   Neck: Normal range of motion.  Cardiovascular: Good peripheral circulation. Respiratory: Normal respiratory effort.  No retractions. Lungs CTAB. Gastrointestinal: No distention.  soft, with mild left upper quadrant tenderness. Approximately 5 cm faint, indistinct subcutaneous mass. Genitourinary: No CVA tenderness. Musculoskeletal: No lower extremity edema.  Extremities warm and well perfused.  Neurologic:  Normal speech and language. No gross focal neurologic deficits are appreciated.  Skin:  Skin is warm and dry. No rash noted. Psychiatric: Mood and affect are normal. Speech and behavior are normal.  ____________________________________________   LABS (all labs ordered are listed, but only abnormal results are displayed)  Labs Reviewed  COMPREHENSIVE METABOLIC PANEL - Abnormal; Notable for the following:       Result Value   Glucose, Bld 102 (*)    All other components within normal limits  URINALYSIS, COMPLETE (UACMP) WITH MICROSCOPIC - Abnormal; Notable for the following:    Color, Urine YELLOW (*)    APPearance CLEAR (*)    Leukocytes, UA SMALL (*)    Squamous Epithelial / LPF 0-5 (*)    All other components within normal limits  LIPASE, BLOOD  CBC  POCT PREGNANCY, URINE   ____________________________________________  EKG  ED ECG REPORT I,  Dionne Bucy, the attending physician, personally viewed and interpreted this ECG.  Date: 09/11/2017 EKG Time: 840 Rate: 83 Rhythm: normal sinus rhythm QRS Axis: normal Intervals: normal ST/T Wave abnormalities: normal Narrative Interpretation: no evidence of acute ischemia  ____________________________________________  RADIOLOGY  CT abd with no mass, hernia, diverticulitis or other acute findings.    ____________________________________________   PROCEDURES  Procedure(s) performed: No    Critical Care performed: No ____________________________________________   INITIAL IMPRESSION / ASSESSMENT AND PLAN / ED COURSE  Pertinent labs & imaging results that were available during my care of the patient were reviewed by me and considered in my medical decision making (see chart for details).  36 year old female with history of cholelithiasis presents with a new left upper quadrant pain for the last day, associated with a mass that she states she is palpated for the last several months. Vital signs are normal, patient is extremely well-appearing, and exam is as described with mild left upper quadrant tenderness. Differential includes gastritis,  PUD, GERD, referred pain from gallstones, diverticulitis, colitis. The mass, if present, is indistinct and seems to be more likely subcutaneous, such as lipoma, rather than intra-abdominal.  Plan: CT abd to eval for acute cause and/or mass.     ----------------------------------------- 12:05 PM on 09/11/2017 -----------------------------------------  CT is negative for any acute findings. Therefore based on left upper quadrant location of the pain I suspect most likely gastritis or PUD versus possible referred pain from her gallbladder. I will discharge with EPI and Maalox and referral to gastroenterology. Return precautions given. Patient feels comfortable to go home.  ____________________________________________   FINAL CLINICAL  IMPRESSION(S) / ED DIAGNOSES  Final diagnoses:  Left upper quadrant pain      NEW MEDICATIONS STARTED DURING THIS VISIT:  New Prescriptions   ALUM & MAG HYDROXIDE-SIMETH (MAALOX ADVANCED MAX ST) 400-400-40 MG/5ML SUSPENSION    Take 10 mLs by mouth every 6 (six) hours as needed for indigestion.   OMEPRAZOLE (PRILOSEC) 40 MG CAPSULE    Take 1 capsule (40 mg total) by mouth daily.     Note:  This document was prepared using Dragon voice recognition software and may include unintentional dictation errors.    Dionne Bucy, MD 09/11/17 1610    Dionne Bucy, MD 09/11/17 1212

## 2017-09-11 NOTE — ED Notes (Signed)
Pt states a "mass" on her upper left abd area that began to hurt last night, denies any nausea or vomiting, states hx of gallstones, awake and alert in no acute distress

## 2017-09-11 NOTE — ED Triage Notes (Signed)
LUQ pain that began yesterday, pt feels that she has a "mass" to LUQ. Pt alert and oriented X4, active, cooperative, pt in NAD. RR even and unlabored, color WNL.

## 2017-09-11 NOTE — Discharge Instructions (Signed)
Return to the ER for new or worsening abdominal pain, constant pain, vomiting or inability to tolerate anything by mouth, fever, weakness, or any other new or worsening symptoms that concern you. Follow-up with your primary care doctor and with a gastroenterologist.

## 2017-11-09 ENCOUNTER — Other Ambulatory Visit: Payer: Self-pay

## 2017-11-09 DIAGNOSIS — N63 Unspecified lump in unspecified breast: Secondary | ICD-10-CM

## 2017-11-09 NOTE — Progress Notes (Signed)
Patient scheduled for annual BCCCP ,and follow-up Birads 3 mammogram for left breast mass on 08/04/18 at 10:00. Mammogram scheduled for 11:20.  Mailed appointment information to patient.

## 2018-08-04 ENCOUNTER — Other Ambulatory Visit: Payer: Self-pay

## 2018-08-04 ENCOUNTER — Ambulatory Visit: Payer: Medicaid Other

## 2018-08-18 ENCOUNTER — Ambulatory Visit: Payer: Medicaid Other | Attending: Oncology

## 2018-08-18 ENCOUNTER — Ambulatory Visit
Admission: RE | Admit: 2018-08-18 | Discharge: 2018-08-18 | Disposition: A | Discharge status: Home / Self Care | Payer: Medicaid Other | Source: Ambulatory Visit | Attending: Oncology | Admitting: Oncology

## 2018-08-18 VITALS — BP 110/76 | HR 84 | Temp 98.4°F | Ht 66.0 in | Wt 279.0 lb

## 2018-08-18 DIAGNOSIS — N63 Unspecified lump in unspecified breast: Secondary | ICD-10-CM

## 2018-08-18 DIAGNOSIS — Z0000 Encounter for general adult medical examination without abnormal findings: Secondary | ICD-10-CM

## 2018-08-18 NOTE — Progress Notes (Signed)
  Subjective:     Patient ID: Michele Novak, female   DOB: 07-29-1981, 37 y.o.   MRN: [id number]  HPI   Review of Systems     Objective:   Physical Exam  Pulmonary/Chest: Right breast exhibits no inverted nipple, no mass, no nipple discharge, no skin change and no tenderness. Left breast exhibits no inverted nipple, no mass, no nipple discharge, no skin change and no tenderness. Breasts are asymmetrical.  Left breast smaller than right       Assessment:     37 year old returns to [facility] for one year follow up left breast nodules for stability. Patient screened, and meets BCCCP eligibility.  Patient does not have insurance, Medicare or Medicaid.  Handout given on Affordable Care Act. Instructed patient on breast self awareness using teach back method.  Clinical breast exam unremarkable. Fibroglandular tissue palpated bilateral.     Patient works at [employer]. Plan:     Sent for bilateral diagnostic mammogram, and ultrasound scheduled for 3:20 today

## 2018-09-24 NOTE — Progress Notes (Unsigned)
Letter mailed from [facility] to notify of normal mammogram results.  Patient to return in one year for annual screening.  Copy to [facility]. 

## 2018-10-01 ENCOUNTER — Other Ambulatory Visit: Payer: Self-pay | Admitting: Family Medicine

## 2018-10-01 DIAGNOSIS — K8020 Calculus of gallbladder without cholecystitis without obstruction: Secondary | ICD-10-CM

## 2018-10-05 ENCOUNTER — Other Ambulatory Visit: Payer: Self-pay | Admitting: Family Medicine

## 2018-10-05 DIAGNOSIS — M7989 Other specified soft tissue disorders: Secondary | ICD-10-CM

## 2018-10-11 ENCOUNTER — Ambulatory Visit
Admission: RE | Admit: 2018-10-11 | Discharge: 2018-10-11 | Disposition: A | Discharge status: Home / Self Care | Payer: Medicaid Other | Source: Ambulatory Visit | Attending: Family Medicine | Admitting: Family Medicine

## 2018-10-18 ENCOUNTER — Ambulatory Visit (INDEPENDENT_AMBULATORY_CARE_PROVIDER_SITE_OTHER): Payer: Medicaid Other | Admitting: General Surgery

## 2018-10-18 ENCOUNTER — Other Ambulatory Visit: Payer: Self-pay

## 2018-10-18 ENCOUNTER — Encounter: Payer: Self-pay | Admitting: General Surgery

## 2018-10-18 VITALS — BP 136/84 | HR 94 | Temp 97.7°F | Resp 18 | Ht 65.0 in | Wt 281.4 lb

## 2018-10-18 DIAGNOSIS — K8020 Calculus of gallbladder without cholecystitis without obstruction: Secondary | ICD-10-CM | POA: Diagnosis not present

## 2018-10-18 NOTE — Progress Notes (Signed)
Michele Novak is a 37 y/o F referred for surgical evaluation of symptomatic cholelithiasis.  She relates 9 year history of intermittent RUQ pain. It is made worse by fatty foods, the greasier, the more severe her symptoms.  She states she is experiencing symptoms today due to having eaten tacos yesterday.  She also notes some positional exacerbation, with bending to the right making it worse.  She has never had jaundice, clay-colored stools, or pancreatitis.  Imaging has been performed in the past that showed significant gallstones, but no GB wall thickening, no pericholecystic fluid, no biliary dilation.  She is seeking surgical intervention at this time due to finally having health insurance.     Past Medical History:  Diagnosis Date  . Gallstones    Past Surgical History:  Procedure Laterality Date  . TONSILLECTOMY    . TUBAL LIGATION     Family History  Problem Relation Age of Onset  . Cancer Father        lung, stomach  . Hypertension Maternal Grandmother   . Cancer Maternal Grandmother   . Cancer Paternal Grandmother   . Breast cancer Neg Hx    Social History   Tobacco Use  . Smoking status: Current Every Day Smoker  . Smokeless tobacco: Never Used  Substance Use Topics  . Alcohol use: No  . Drug use: No   No outpatient medications have been marked as taking for the 10/18/18 encounter (Office Visit) with Duanne Guess, MD.   Allergies  Allergen Reactions  . Other    ROS: aside from the HPI, she does endorse LE edema, worse with prolonged standing.  Otherwise negative.  PE: Vitals:   10/18/18 1420  BP: 136/84  Pulse: 94  Resp: 18  Temp: 97.7 F (36.5 C)  SpO2: 99%   GEN: A&O x 3. NAD HEENT: PERRL. No scleral icterus. Xanthelasma present. MMM. No palpable cervical LAD.  No thyroid enlargement appreciated. CV: RRR, no murmur. Pulm: CTAB Abd: soft, protuberant, consistent with BMI of 46.  No scars aside from stretch marks.  Tender to deep palpation in the RUQ,  but no Murphy sign. Ext: warm, pedal pulses palpable Psych: normal mood and affect.  Labs: most recent CMP in Epic is from 08/2017.  No abnormalities.  Imaging: most recent imaging in Epic is from 08/2017 and is a CT scan of the abdomen.  Cholelithiasis is noted without concern for cholecystitis.  I personally reviewed this imaging.  A/P: 37 y/o F with symptomatic cholelithiasis.  I have offered her a laparoscopic cholecystectomy.  The risks of the operation were discussed, including but not limited to: bleeding, infection, damage to surrounding tissues, common bile duct injury or transection, prolonged ventilator requirement, need to convert to an open operation, need for hospitalization.  We will schedule her in the upcoming weeks.

## 2018-10-18 NOTE — Patient Instructions (Addendum)
You have requested to have your gallbladder removed. This will be done at [facility] with Dr. Lady Gary.  Laparoscopic Cholecystectomy Laparoscopic cholecystectomy is surgery to remove the gallbladder. The gallbladder is located in the upper right part of the abdomen, behind the liver. It is a storage sac for bile, which is produced in the liver. Bile aids in the digestion and absorption of fats. Cholecystectomy is often done for inflammation of the gallbladder (cholecystitis). This condition is usually caused by a buildup of gallstones (cholelithiasis) in the gallbladder. Gallstones can block the flow of bile, and that can result in inflammation and pain. In severe cases, emergency surgery may be required. If emergency surgery is not required, you will have time to prepare for the procedure. Laparoscopic surgery is an alternative to open surgery. Laparoscopic surgery has a shorter recovery time. Your common bile duct may also need to be examined during the procedure. If stones are found in the common bile duct, they may be removed. LET [facility] CARE PROVIDER KNOW ABOUT:  Any allergies you have.  All medicines you are taking, including vitamins, herbs, eye drops, creams, and over-the-counter medicines.  Previous problems you or members of your family have had with the use of anesthetics.  Any blood disorders you have.  Previous surgeries you have had.    Any medical conditions you have. RISKS AND COMPLICATIONS Generally, this is a safe procedure. However, problems may occur, including:  Infection.  Bleeding.  Allergic reactions to medicines.  Damage to other structures or organs.  A stone remaining in the common bile duct.  A bile leak from the cyst duct that is clipped when your gallbladder is removed.  The need to convert to open surgery, which requires a larger incision in the abdomen. This may be necessary if your surgeon thinks that it is not safe to continue with a  laparoscopic procedure. BEFORE THE PROCEDURE  Ask your health care provider about:  Changing or stopping your regular medicines. This is especially important if you are taking diabetes medicines or blood thinners.  Taking medicines such as aspirin and ibuprofen. These medicines can thin your blood. Do not take these medicines before your procedure if your health care provider instructs you not to.  Follow instructions from your health care provider about eating or drinking restrictions.  Let your health care provider know if you develop a cold or an infection before surgery.  Plan to have someone take you home after the procedure.  Ask your health care provider how your surgical site will be marked or identified.  You may be given antibiotic medicine to help prevent infection. PROCEDURE  To reduce your risk of infection:  Your health care team will wash or sanitize their hands.  Your skin will be washed with soap.  An IV tube may be inserted into one of your veins.  You will be given a medicine to make you fall asleep (general anesthetic).  A breathing tube will be placed in your mouth.  The surgeon will make several small cuts (incisions) in your abdomen.  A thin, lighted tube (laparoscope) that has a tiny camera on the end will be inserted through one of the small incisions. The camera on the laparoscope will send a picture to a TV screen (monitor) in the operating room. This will give the surgeon a good view inside your abdomen.  A gas will be pumped into your abdomen. This will expand your abdomen to give the surgeon more room to perform  the surgery.  Other tools that are needed for the procedure will be inserted through the other incisions. The gallbladder will be removed through one of the incisions.  After your gallbladder has been removed, the incisions will be closed with stitches (sutures), staples, or skin glue.  Your incisions may be covered with a bandage  (dressing). The procedure may vary among health care providers and hospitals. AFTER THE PROCEDURE  Your blood pressure, heart rate, breathing rate, and blood oxygen level will be monitored often until the medicines you were given have worn off.  You will be given medicines as needed to control your pain.   This information is not intended to replace advice given to you by your health care provider. Make sure you discuss any questions you have with your health care provider.   Document Released: 12/01/2005 Document Revised: 08/22/2015 Document Reviewed: 07/13/2013 Elsevier Interactive Patient Education 2016 Elsevier Inc.   Low-Fat Diet for Gallbladder Conditions A low-fat diet can be helpful if you have pancreatitis or a gallbladder condition. With these conditions, your pancreas and gallbladder have trouble digesting fats. A healthy eating plan with less fat will help rest your pancreas and gallbladder and reduce your symptoms. WHAT DO I NEED TO KNOW ABOUT THIS DIET?  Eat a low-fat diet.  Reduce your fat intake to less than 20-30% of your total daily calories. This is less than 50-60 g of fat per day.  Remember that you need some fat in your diet. Ask your dietician what your daily goal should be.  Choose nonfat and low-fat healthy foods. Look for the words "nonfat," "low fat," or "fat free."  As a guide, look on the label and choose foods with less than 3 g of fat per serving. Eat only one serving.  Avoid alcohol.  Do not smoke. If you need help quitting, talk with your health care provider.  Eat small frequent meals instead of three large heavy meals. WHAT FOODS CAN I EAT? Grains Include healthy grains and starches such as potatoes, wheat bread, fiber-rich cereal, and brown rice. Choose whole grain options whenever possible. In adults, whole grains should account for 45-65% of your daily calories.  Fruits and Vegetables Eat plenty of fruits and vegetables. Fresh fruits and  vegetables add fiber to your diet. Meats and Other Protein Sources Eat lean meat such as chicken and pork. Trim any fat off of meat before cooking it. Eggs, fish, and beans are other sources of protein. In adults, these foods should account for 10-35% of your daily calories. Dairy Choose low-fat milk and dairy options. Dairy includes fat and protein, as well as calcium.  Fats and Oils Limit high-fat foods such as fried foods, sweets, baked goods, sugary drinks.  Other Creamy sauces and condiments, such as mayonnaise, can add extra fat. Think about whether or not you need to use them, or use smaller amounts or low fat options. WHAT FOODS ARE NOT RECOMMENDED?  High fat foods, such as:  [employer].  Ice cream.  [place] toast.  Sweet rolls.  Pizza.  Cheese bread.  Foods covered with batter, butter, creamy sauces, or cheese.  Fried foods.  Sugary drinks and desserts.  Foods that cause gas or bloating   This information is not intended to replace advice given to you by your health care provider. Make sure you discuss any questions you have with your health care provider.   Document Released: 12/06/2013 Document Reviewed: 12/06/2013 Elsevier Interactive Patient Education [employer]  Cholelithiasis Cholelithiasis is also called "gallstones." It is a kind of gallbladder disease. The gallbladder is an organ that stores a liquid (bile) that helps you digest fat. Gallstones may not cause symptoms (may be silent gallstones) until they cause a blockage, and then they can cause pain (gallbladder attack). Follow these instructions at home:  Take over-the-counter and prescription medicines only as told by your doctor.  Stay at a healthy weight.  Eat healthy foods. This includes: ? Eating fewer fatty foods, like fried foods. ? Eating fewer refined carbs (refined carbohydrates). Refined carbs are breads and grains that are highly processed, like white bread and white  rice. Instead, choose whole grains like whole-wheat bread and brown rice. ? Eating more fiber. Almonds, fresh fruit, and beans are healthy sources of fiber.  Keep all follow-up visits as told by your doctor. This is important. Contact a doctor if:  You have sudden pain in the upper right side of your belly (abdomen). Pain might spread to your right shoulder or your chest. This may be a sign of a gallbladder attack.  You feel sick to your stomach (are nauseous).  You throw up (vomit).  You have been diagnosed with gallstones that have no symptoms and you get: ? Belly pain. ? Discomfort, burning, or fullness in the upper part of your belly (indigestion). Get help right away if:  You have sudden pain in the upper right side of your belly, and it lasts for more than 2 hours.  You have belly pain that lasts for more than 5 hours.  You have a fever or chills.  You keep feeling sick to your stomach or you keep throwing up.  Your skin or the whites of your eyes turn yellow (jaundice).  You have dark-colored pee (urine).  You have light-colored poop (stool). Summary  Cholelithiasis is also called "gallstones."  The gallbladder is an organ that stores a liquid (bile) that helps you digest fat.  Silent gallstones are gallstones that do not cause symptoms.  A gallbladder attack may cause sudden pain in the upper right side of your belly. Pain might spread to your right shoulder or your chest. If this happens, contact your doctor.  If you have sudden pain in the upper right side of your belly that lasts for more than 2 hours, get help right away. This information is not intended to replace advice given to you by your health care provider. Make sure you discuss any questions you have with your health care provider. Document Released: 05/19/2008 Document Revised: 08/17/2016 Document Reviewed: 08/17/2016 Elsevier Interactive Patient Education  2017 [employer].

## 2018-10-20 ENCOUNTER — Encounter: Payer: Self-pay | Admitting: *Deleted

## 2018-10-20 NOTE — Progress Notes (Signed)
Patient's surgery has been scheduled for 11-02-18 at [facility] with Dr. Lady Gary.   The patient has been notified of pre-admission appointment day and time.   The patient is aware to call the office should they have further questions.

## 2018-10-20 NOTE — Addendum Note (Signed)
Addended by: Duanne Guess on: 10/20/2018 09:50 AM   Modules accepted: Orders, SmartSet

## 2018-10-27 ENCOUNTER — Inpatient Hospital Stay: Admission: RE | Admit: 2018-10-27 | Payer: Medicaid Other | Source: Ambulatory Visit

## 2018-10-28 ENCOUNTER — Telehealth: Payer: Self-pay | Admitting: *Deleted

## 2018-10-28 NOTE — Telephone Encounter (Signed)
I contacted patient this morning since Pre-admit had reported that she did not show to her appointment scheduled yesterday.   The patient wishes to postpone surgery until January to get her through the Holidays.   Surgery has been rescheduled from 11-02-18 to 12-24-2018 with Dr. Lady Garyannon and Dr. Everlene FarrierPabon to assist.   The patient has been scheduled for a pre-op visit with Dr. Lady Garyannon and is aware of date, time, and instructions (Appt- 12-13-18 at 9 am).   *Message was left with the patient's new pre-admit appointment date and time scheduled for 12-17-2018 at 9 am.

## 2018-12-13 ENCOUNTER — Ambulatory Visit: Payer: Medicaid Other | Admitting: General Surgery

## 2018-12-13 ENCOUNTER — Encounter: Payer: Self-pay | Admitting: *Deleted

## 2018-12-15 DIAGNOSIS — S82409A Unspecified fracture of shaft of unspecified fibula, initial encounter for closed fracture: Secondary | ICD-10-CM

## 2018-12-15 HISTORY — DX: Unspecified fracture of shaft of unspecified fibula, initial encounter for closed fracture: S82.409A

## 2018-12-16 ENCOUNTER — Telehealth: Payer: Self-pay | Admitting: *Deleted

## 2018-12-16 NOTE — Telephone Encounter (Signed)
Another message was left for patient to call the office.   Patient missed pre-op appointment on 12-13-18 with Dr. Lady Gary.   We need to see if patient still wants to have gallbladder surgery or if she wishes to cancel.   Surgery is presently scheduled for 12-24-18.

## 2018-12-17 ENCOUNTER — Inpatient Hospital Stay: Admission: RE | Admit: 2018-12-17 | Payer: Medicaid Other | Source: Ambulatory Visit

## 2018-12-20 ENCOUNTER — Encounter: Payer: Self-pay | Admitting: *Deleted

## 2018-12-20 ENCOUNTER — Inpatient Hospital Stay
Admission: RE | Admit: 2018-12-20 | Discharge: 2018-12-20 | Disposition: A | Discharge status: Home / Self Care | Payer: Medicaid Other | Source: Ambulatory Visit

## 2018-12-20 NOTE — Pre-Procedure Instructions (Signed)
NO PREOP PHONE CALL MADE. PATIENT NO SHOW FOR PREOP LAST WEEK AND SURGEON'S OFFICE UNABLE TO GET RESPONE TO THEIR CALLS. WILL POSSIBLY BE CNL PER MICHELLE AT DR America Brown

## 2018-12-20 NOTE — Progress Notes (Signed)
Patient was contacted today since she was a no show for pre-op visit with Dr. Lady Gary and a no show for Pre-admission Testing appointment per anesthesia.   The patient states she does not wish to proceed with gallbladder surgery at this time.   Patient instructed to contact the office if we can be of further assistance. She verbalizes understanding.

## 2018-12-24 ENCOUNTER — Ambulatory Visit: Admission: RE | Admit: 2018-12-24 | Payer: Medicaid Other | Source: Ambulatory Visit | Admitting: General Surgery

## 2018-12-24 ENCOUNTER — Encounter: Admission: RE | Payer: Self-pay | Source: Ambulatory Visit

## 2018-12-24 SURGERY — LAPAROSCOPIC CHOLECYSTECTOMY
Anesthesia: Choice

## 2019-04-15 ENCOUNTER — Emergency Department: Payer: Medicaid Other

## 2019-04-15 ENCOUNTER — Other Ambulatory Visit: Payer: Self-pay

## 2019-04-15 ENCOUNTER — Emergency Department
Admission: EM | Admit: 2019-04-15 | Discharge: 2019-04-15 | Disposition: A | Discharge status: Home / Self Care | Payer: Medicaid Other | Attending: Emergency Medicine | Admitting: Emergency Medicine

## 2019-04-15 ENCOUNTER — Encounter: Payer: Self-pay | Admitting: Emergency Medicine

## 2019-04-15 DIAGNOSIS — Y929 Unspecified place or not applicable: Secondary | ICD-10-CM | POA: Diagnosis not present

## 2019-04-15 DIAGNOSIS — X509XXA Other and unspecified overexertion or strenuous movements or postures, initial encounter: Secondary | ICD-10-CM | POA: Insufficient documentation

## 2019-04-15 DIAGNOSIS — Y999 Unspecified external cause status: Secondary | ICD-10-CM | POA: Diagnosis not present

## 2019-04-15 DIAGNOSIS — Y939 Activity, unspecified: Secondary | ICD-10-CM | POA: Insufficient documentation

## 2019-04-15 DIAGNOSIS — F17210 Nicotine dependence, cigarettes, uncomplicated: Secondary | ICD-10-CM | POA: Diagnosis not present

## 2019-04-15 DIAGNOSIS — S99911A Unspecified injury of right ankle, initial encounter: Secondary | ICD-10-CM | POA: Diagnosis present

## 2019-04-15 DIAGNOSIS — S82831A Other fracture of upper and lower end of right fibula, initial encounter for closed fracture: Secondary | ICD-10-CM | POA: Diagnosis not present

## 2019-04-15 MED ORDER — IBUPROFEN 600 MG PO TABS: 600.0000 mg | ORAL_TABLET | Freq: Four times a day (QID) | ORAL | 0 refills | Status: DC | PRN

## 2019-04-15 MED ORDER — OXYCODONE-ACETAMINOPHEN 5-325 MG PO TABS: 1.0000 | ORAL_TABLET | Freq: Four times a day (QID) | ORAL | 0 refills | Status: DC | PRN

## 2019-04-15 MED ORDER — OXYCODONE-ACETAMINOPHEN 5-325 MG PO TABS: 1.0000 | ORAL_TABLET | Freq: Four times a day (QID) | ORAL | 0 refills | Status: AC | PRN

## 2019-04-15 NOTE — ED Notes (Signed)

## 2019-04-15 NOTE — ED Triage Notes (Signed)
Pt to ED via POV stating that she thinks she broke her right ankle. Pt was at work and fell. Pt is in NAD at this time.

## 2019-04-15 NOTE — ED Notes (Signed)
Pt states this visit will NOT be worker's comp.

## 2019-04-15 NOTE — ED Provider Notes (Signed)
[facility] Emergency Department Provider Note  ____________________________________________  Time seen: Approximately 1:08 PM  I have reviewed the triage vital signs and the nursing notes.   HISTORY  Chief Complaint Ankle Pain    HPI Michele Novak is a 38 y.o. female presents emergency department for evaluation of right ankle pain after injury today.  Patient states that she stepped wrong and her ankle rolled inward.  She heard a pop.  She thinks she may have felt some tingling after injury.  No additional injuries.   Past Medical History:  Diagnosis Date  . Gallstones     There are no active problems to display for this patient.   Past Surgical History:  Procedure Laterality Date  . TONSILLECTOMY    . TUBAL LIGATION      Prior to Admission medications   Medication Sig Start Date End Date Taking? Authorizing Provider  ibuprofen (ADVIL) 600 MG tablet Take 1 tablet (600 mg total) by mouth every 6 (six) hours as needed. 04/15/19   Enid Derry, PA-C  medroxyPROGESTERone (PROVERA) 10 MG tablet Take 1 tablet (10 mg total) by mouth daily. Patient not taking: Reported on 05/09/2016 10/04/15   Emily Filbert, MD  meloxicam (MOBIC) 15 MG tablet Take 1 tablet (15 mg total) by mouth daily. Patient not taking: Reported on 09/11/2017 04/12/17   Cuthriell, Delorise Royals, PA-C  oxyCODONE-acetaminophen (PERCOCET) 5-325 MG tablet Take 1 tablet by mouth every 6 (six) hours as needed for up to 3 days for severe pain. 04/15/19 04/18/19  Enid Derry, PA-C    Allergies Patient has no known allergies.  Family History  Problem Relation Age of Onset  . Cancer Father        lung, stomach  . Hypertension Maternal Grandmother   . Cancer Maternal Grandmother   . Cancer Paternal Grandmother   . Breast cancer Neg Hx     Social History Social History   Tobacco Use  . Smoking status: Current Every Day Smoker  . Smokeless tobacco: Never Used  Substance Use Topics   . Alcohol use: No  . Drug use: No     Review of Systems  Gastrointestinal: No nausea, no vomiting.  Musculoskeletal: Positive for ankle pain. Skin: Negative for rash, abrasions, lacerations, ecchymosis. Neurological: Negative for numbness or tingling   ____________________________________________   PHYSICAL EXAM:  VITAL SIGNS: ED Triage Vitals  Enc Vitals Group     BP 04/15/19 1108 136/86     Pulse Rate 04/15/19 1108 93     Resp 04/15/19 1108 10     Temp 04/15/19 1108 97.7 F (36.5 C)     Temp Source 04/15/19 1108 Oral     SpO2 04/15/19 1108 97 %     Weight 04/15/19 1111 262 lb (118.8 kg)     Height 04/15/19 1111  (1.651 m)     Head Circumference --      Peak Flow --      Pain Score 04/15/19 1105 7     Pain Loc --      Pain Edu? --      Excl. in GC? --      Constitutional: Alert and oriented. Well appearing and in no acute distress. Eyes: Conjunctivae are normal. PERRL. EOMI. Head: Atraumatic. ENT:      Ears:      Nose: No congestion/rhinnorhea.      Mouth/Throat: Mucous membranes are moist.  Neck: No stridor.   Cardiovascular: Normal rate, regular rhythm.  Good peripheral  circulation.  Cap refill less than 2 seconds. Respiratory: Normal respiratory effort without tachypnea or retractions. Lungs CTAB. Good air entry to the bases with no decreased or absent breath sounds. Musculoskeletal: Full range of motion to all extremities. No gross deformities appreciated.  Swelling and tenderness to palpation to lateral malleolus. Neurologic:  Normal speech and language. No gross focal neurologic deficits are appreciated.  Skin:  Skin is warm, dry and intact. No rash noted. Psychiatric: Mood and affect are normal. Speech and behavior are normal. Patient exhibits appropriate insight and judgement.   ____________________________________________   LABS (all labs ordered are listed, but only abnormal results are displayed)  Labs Reviewed - No data to  display ____________________________________________  EKG   ____________________________________________  RADIOLOGY Lexine Baton, personally viewed and evaluated these images (plain radiographs) as part of my medical decision making, as well as reviewing the written report by the radiologist.  Dg Ankle Complete Right  Result Date: 04/15/2019 CLINICAL DATA:  Acute right ankle pain after fall at work today. EXAM: RIGHT ANKLE - COMPLETE 3+ VIEW COMPARISON:  None. FINDINGS: Mildly displaced fracture is seen involving the distal right fibula. The tibia is unremarkable. Joint spaces are intact. IMPRESSION: Mildly displaced distal right fibular fracture. Electronically Signed   By: Lupita Raider M.D.   On: 04/15/2019 11:56    ____________________________________________    PROCEDURES  Procedure(s) performed:    Procedures    Medications - No data to display   ____________________________________________   INITIAL IMPRESSION / ASSESSMENT AND PLAN / ED COURSE  Pertinent labs & imaging results that were available during my care of the patient were reviewed by me and considered in my medical decision making (see chart for details).  Review of the [place] CSRS was performed in accordance of the [facility] prior to dispensing any controlled drugs.   Patient's diagnosis is consistent with mildly displaced fibula fracture.  Vital signs and exam are reassuring.  X-ray consistent with fracture.  Ankle splint was placed.  Crutches were given.  Patient will be discharged home with prescriptions for a short course of Percocet. Patient is to follow up with orthopedics as directed.  Referral was given to Dr. Allena Katz.  Patient is given ED precautions to return to the ED for any worsening or new symptoms.     ____________________________________________  FINAL CLINICAL IMPRESSION(S) / ED DIAGNOSES  Final diagnoses:  Closed fracture of distal end of right fibula, unspecified fracture morphology,  initial encounter      NEW MEDICATIONS STARTED DURING THIS VISIT:  ED Discharge Orders         Ordered    oxyCODONE-acetaminophen (PERCOCET) 5-325 MG tablet  Every 6 hours PRN,   Status:  Discontinued     04/15/19 1320    ibuprofen (ADVIL) 600 MG tablet  Every 6 hours PRN,   Status:  Discontinued     04/15/19 1320    ibuprofen (ADVIL) 600 MG tablet  Every 6 hours PRN,   Status:  Discontinued     04/15/19 1359    oxyCODONE-acetaminophen (PERCOCET) 5-325 MG tablet  Every 6 hours PRN,   Status:  Discontinued     04/15/19 1359    oxyCODONE-acetaminophen (PERCOCET) 5-325 MG tablet  Every 6 hours PRN     04/15/19 1400    ibuprofen (ADVIL) 600 MG tablet  Every 6 hours PRN     04/15/19 1400              This chart was dictated using  voice recognition software/Dragon. Despite best efforts to proofread, errors can occur which can change the meaning. Any change was purely unintentional.    Enid DerryWagner, Dwana Garin, PA-C 04/15/19 1549    Arnaldo NatalMalinda, Paul F, MD 04/15/19 [phone]

## 2019-04-15 NOTE — Discharge Instructions (Signed)
You have broken your fibula.  Please wear splint and use crutches until evaluated by orthopedics.  Keep foot elevated and ice ankle today.  You can take ibuprofen for pain and inflammation and Percocet for extreme pain.  Please call Dr. Allena Katz for an appointment next week.

## 2019-04-28 ENCOUNTER — Other Ambulatory Visit (HOSPITAL_COMMUNITY): Payer: Self-pay | Admitting: Student

## 2019-04-28 ENCOUNTER — Other Ambulatory Visit: Payer: Self-pay

## 2019-04-28 ENCOUNTER — Other Ambulatory Visit: Payer: Self-pay | Admitting: Student

## 2019-04-28 ENCOUNTER — Ambulatory Visit
Admission: RE | Admit: 2019-04-28 | Discharge: 2019-04-28 | Disposition: A | Discharge status: Home / Self Care | Payer: Medicaid Other | Source: Ambulatory Visit | Attending: Student | Admitting: Student

## 2019-04-28 DIAGNOSIS — M7989 Other specified soft tissue disorders: Secondary | ICD-10-CM

## 2019-07-04 IMAGING — US US BREAST*L* LIMITED INC AXILLA
1 series · 9 of 9 positions shown · non-contrast
Comparison: Previous exam(s).

CLINICAL DATA: Left breast follow-up

EXAM:
2D DIGITAL DIAGNOSTIC BILATERAL MAMMOGRAM WITH CAD AND ADJUNCT TOMO
ULTRASOUND LEFT BREAST

[Series 1: us breast*left* limited inc axilla · 0.07mm/px · 9 of 9 slices shown]
[im 1/9]
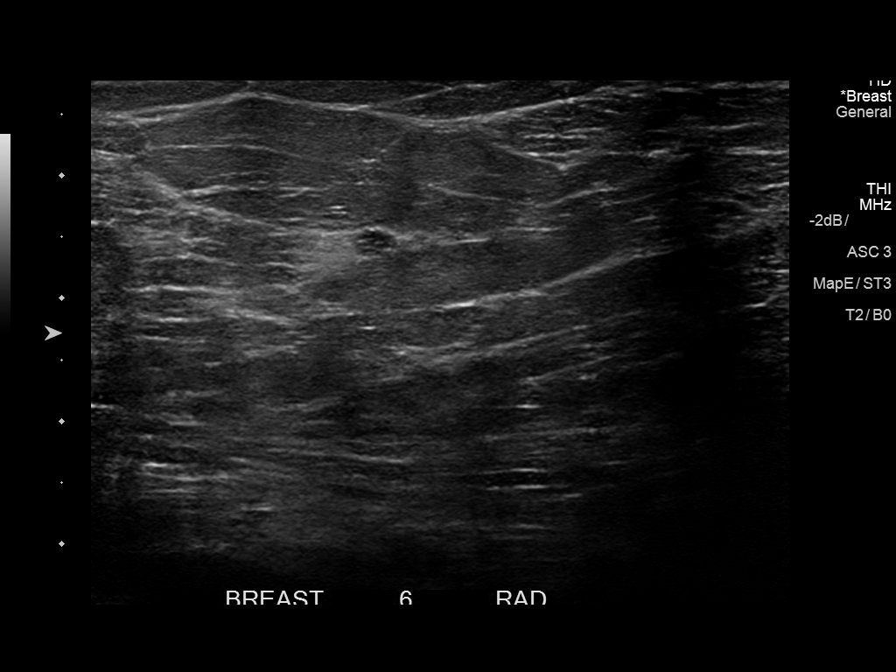
[im 2/9]
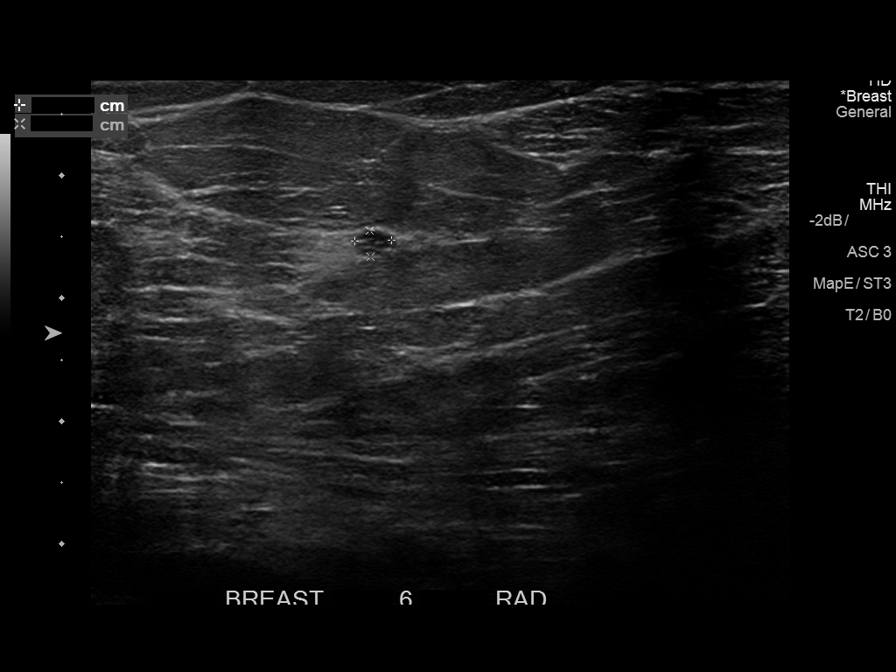
[im 3/9]
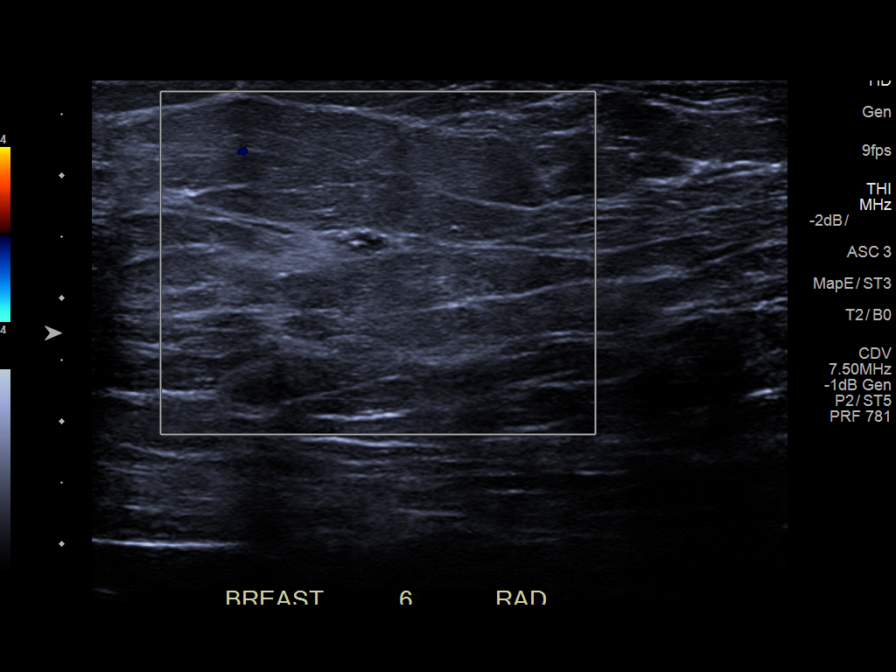
[im 4/9]
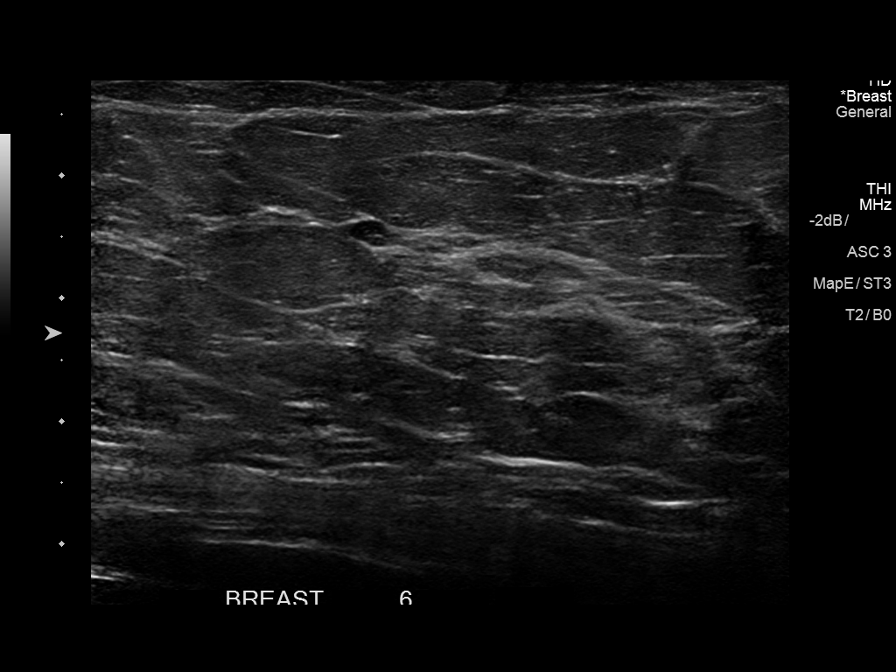
[im 5/9]
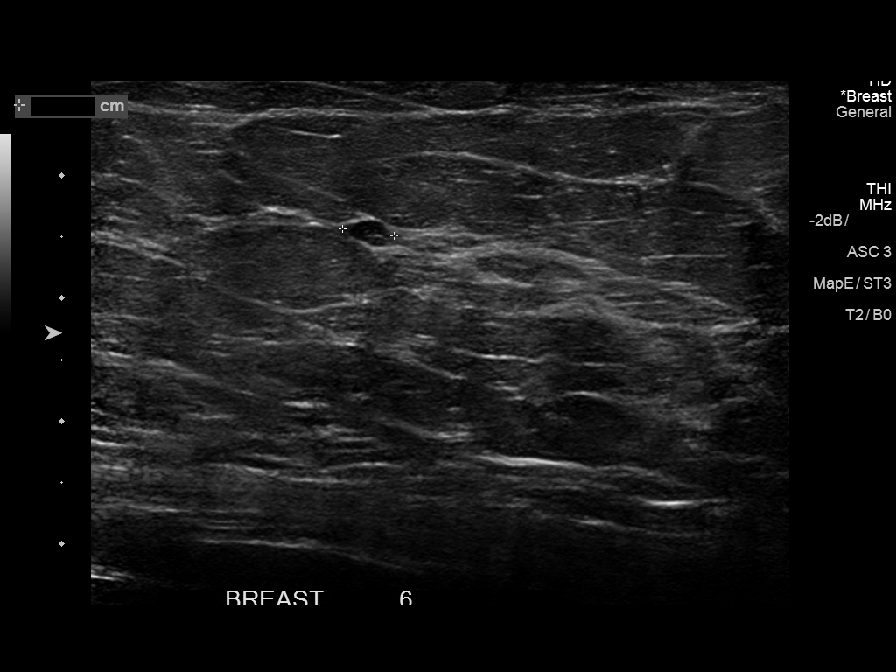
[im 6/9]
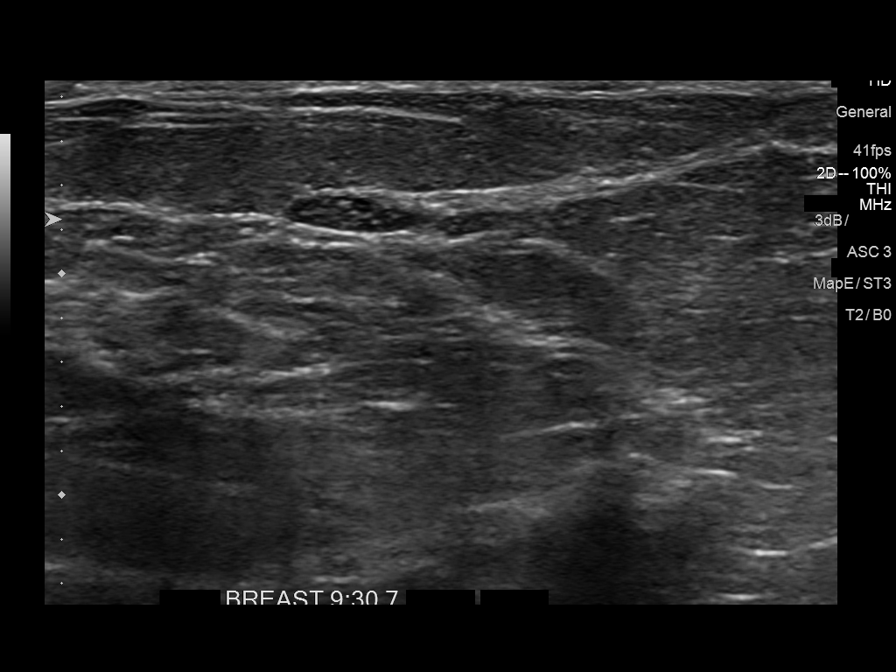
[im 7/9]
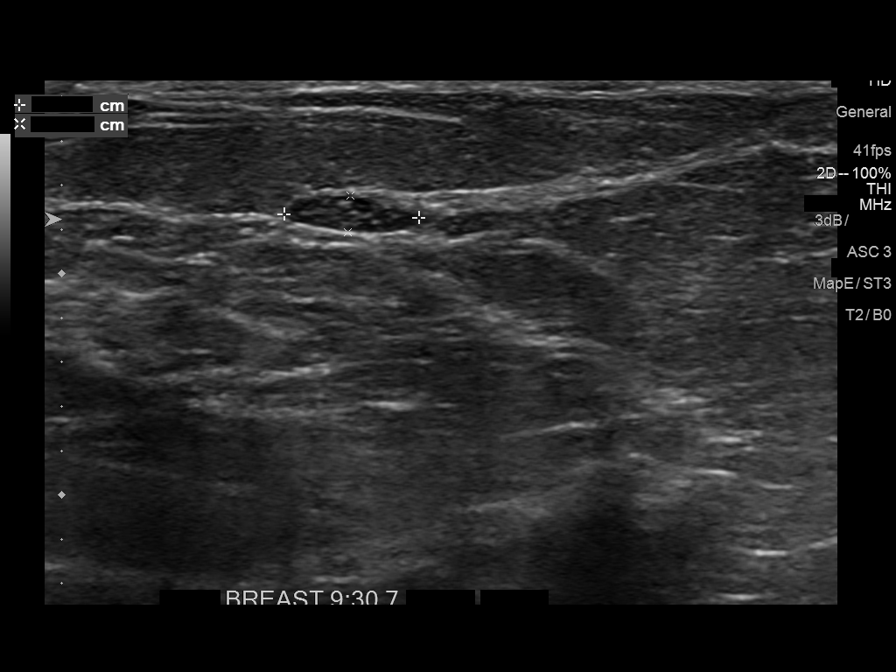
[im 8/9]
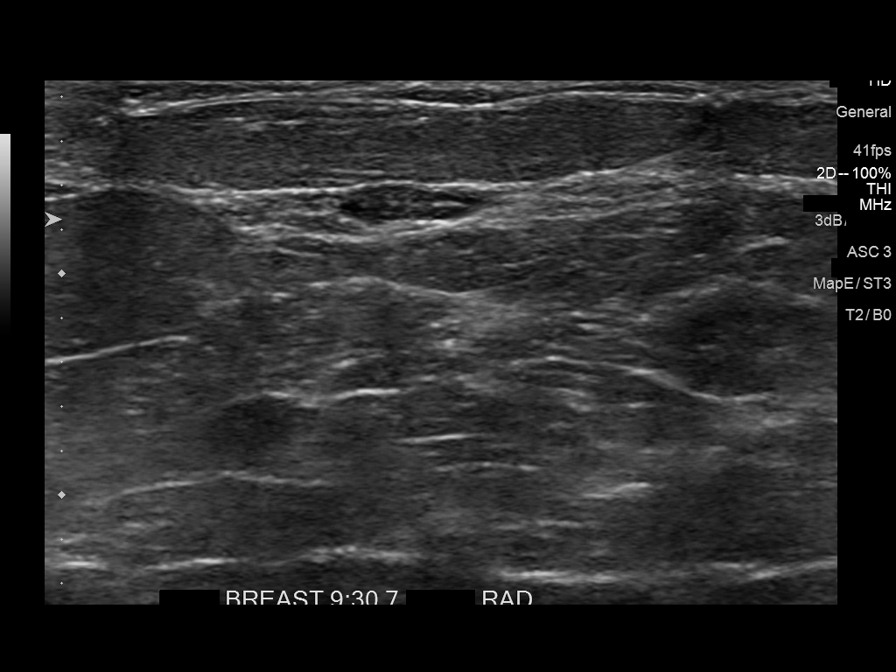
[im 9/9]
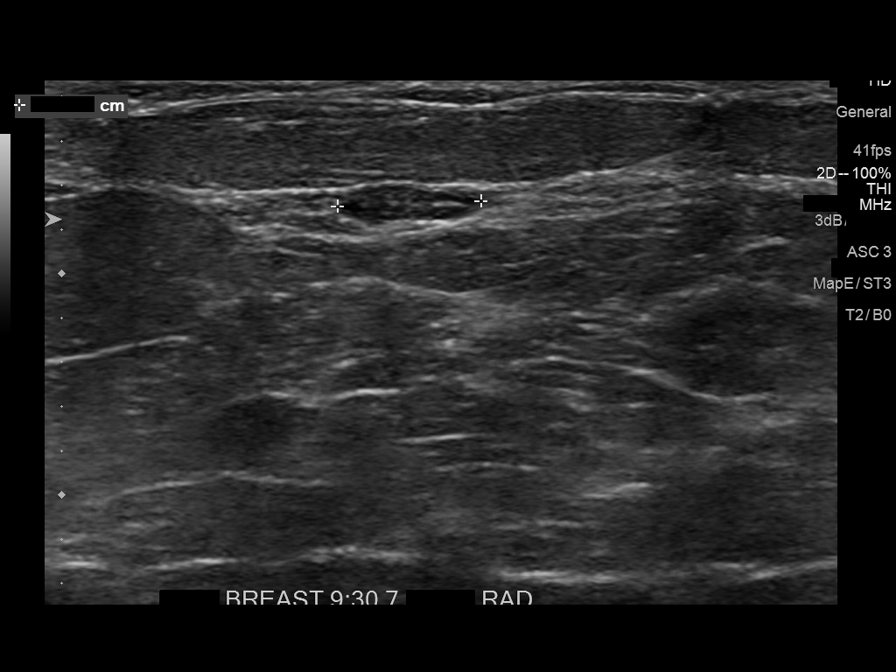

[9 of 9 positions shown; findings below may reference images not displayed]

ACR Breast Density Category b: There are scattered areas of
fibroglandular density.
FINDINGS: The previously described asymmetries within the subareolar and
medial left breast are not significantly changed from the June 2016
mammogram. No suspicious mass, calcifications, or other abnormality
is identified within either breast.

Mammographic images were processed with CAD.

Targeted ultrasound of the left breast demonstrates an unchanged
appearing oval, circumscribed mass at [DATE], 7 cm from the nipple
measuring 7 x 6 x 2 mm. The previously noted oval, circumscribed,
near anechoic mass at 11 o'clock, 6 cm from the nipple measures 4 x
Measurement differences may be secondary to technique.
IMPRESSION: Probably benign left breast findings.

RECOMMENDATION:
Bilateral diagnostic mammogram and possible left breast ultrasound 1
year.

I have discussed the findings and recommendations with the patient.
Results were also provided in writing at the conclusion of the
visit. If applicable, a reminder letter will be sent to the patient
regarding the next appointment.

BI-RADS CATEGORY  3: Probably benign.

## 2019-09-07 ENCOUNTER — Other Ambulatory Visit: Payer: Self-pay | Admitting: Family Medicine

## 2019-09-07 DIAGNOSIS — M7989 Other specified soft tissue disorders: Secondary | ICD-10-CM

## 2019-09-16 ENCOUNTER — Other Ambulatory Visit: Payer: Self-pay

## 2019-09-16 ENCOUNTER — Ambulatory Visit
Admission: RE | Admit: 2019-09-16 | Discharge: 2019-09-16 | Disposition: A | Discharge status: Home / Self Care | Payer: Medicaid Other | Source: Ambulatory Visit | Attending: Family Medicine | Admitting: Family Medicine

## 2019-09-16 DIAGNOSIS — M7989 Other specified soft tissue disorders: Secondary | ICD-10-CM | POA: Insufficient documentation

## 2019-10-17 ENCOUNTER — Other Ambulatory Visit: Payer: Self-pay | Admitting: Family Medicine

## 2019-10-17 DIAGNOSIS — M7989 Other specified soft tissue disorders: Secondary | ICD-10-CM

## 2019-10-24 ENCOUNTER — Ambulatory Visit
Admission: RE | Admit: 2019-10-24 | Discharge: 2019-10-24 | Disposition: A | Discharge status: Home / Self Care | Payer: Medicaid Other | Source: Ambulatory Visit | Attending: Family Medicine | Admitting: Family Medicine

## 2019-10-24 ENCOUNTER — Other Ambulatory Visit: Payer: Self-pay

## 2019-10-24 DIAGNOSIS — M7989 Other specified soft tissue disorders: Secondary | ICD-10-CM | POA: Diagnosis not present

## 2019-10-24 MED ORDER — IOHEXOL 300 MG/ML  SOLN
100.0000 mL | Freq: Once | INTRAMUSCULAR | Status: AC | PRN
  Administered 2019-10-24: 15:00:00 100 mL via INTRAVENOUS

## 2020-04-12 ENCOUNTER — Ambulatory Visit: Payer: Medicaid Other | Admitting: General Surgery

## 2020-04-12 ENCOUNTER — Other Ambulatory Visit: Payer: Self-pay

## 2020-04-12 ENCOUNTER — Encounter: Payer: Self-pay | Admitting: General Surgery

## 2020-04-12 VITALS — BP 129/83 | HR 106 | Temp 97.9°F | Resp 12 | Ht 65.0 in | Wt 287.8 lb

## 2020-04-12 DIAGNOSIS — K8020 Calculus of gallbladder without cholecystitis without obstruction: Secondary | ICD-10-CM | POA: Diagnosis not present

## 2020-04-12 NOTE — H&P (View-Only) (Signed)
Patient ID: Michele Novak, female   DOB: 1981-03-07, 39 y.o.   MRN: [id number]  Chief Complaint  Patient presents with  . Follow-up    Cholelithiasis    HPI Michele Novak is a 39 y.o. female.  I initially saw her in November 2019.  My initial HPI is copied here:  "Michele Novak is a 39 y/o F referred for surgical evaluation of symptomatic cholelithiasis.  She relates 9 year history of intermittent RUQ pain. It is made worse by fatty foods, the greasier, the more severe her symptoms.  She states she is experiencing symptoms today due to having eaten tacos yesterday.  She also notes some positional exacerbation, with bending to the right making it worse.  She has never had jaundice, clay-colored stools, or pancreatitis.  Imaging has been performed in the past that showed significant gallstones, but no GB wall thickening, no pericholecystic fluid, no biliary dilation.  She is seeking surgical intervention at this time due to finally having health insurance."     She was scheduled for surgery at that time, however she says that she "chickened out."  She says that she has had continued flareups since that time.  Her symptoms are continually exacerbated by eating spicy or fatty foods.  She denies any fevers or chills.  No nausea or vomiting.  She does endorse having somewhat greasy stools but no frank diarrhea.  She states that her appetite is normal.  Due to ongoing symptoms, she is again interested in having her gallbladder removed.   Past Medical History:  Diagnosis Date  . Gallstones     Past Surgical History:  Procedure Laterality Date  . TONSILLECTOMY    . TUBAL LIGATION      Family History  Problem Relation Age of Onset  . Cancer Father        lung, stomach  . Hypertension Maternal Grandmother   . Cancer Maternal Grandmother   . Cancer Paternal Grandmother   . Breast cancer Neg Hx     Social History Social History   Tobacco Use  . Smoking status: Current Every Day Smoker   . Smokeless tobacco: Never Used  Substance Use Topics  . Alcohol use: No  . Drug use: No    No Known Allergies  Current Outpatient Medications  Medication Sig Dispense Refill  . medroxyPROGESTERone (PROVERA) 10 MG tablet Take 1 tablet (10 mg total) by mouth daily. 10 tablet 0  . meloxicam (MOBIC) 15 MG tablet Take 1 tablet (15 mg total) by mouth daily. 30 tablet 0  . naproxen (NAPROSYN) 500 MG tablet TAKE 1 TABLET BY MOUTH TWICE DAILY AS NEEDED FOR PAIN     No current facility-administered medications for this visit.    Review of Systems Review of Systems  All other systems reviewed and are negative. Or as discussed in the history of present illness.  Blood pressure 129/83, pulse (!) 106, temperature 97.9 F (36.6 C), resp. rate 12, height 5\' 5"  (1.651 m), weight 287 lb 12.8 oz (130.5 kg), SpO2 98 %. Body mass index is 47.89 kg/m.  Physical Exam Physical Exam Vitals reviewed.  Constitutional:      General: She is not in acute distress.    Appearance: Normal appearance. She is obese.  HENT:     Head: Normocephalic and atraumatic.     Nose:     Comments: Covered with a mask secondary to COVID-19 precautions    Mouth/Throat:     Comments: Covered with a mask secondary to COVID-19  precautions Eyes:     General: No scleral icterus.       Right eye: No discharge.        Left eye: No discharge.     Conjunctiva/sclera: Conjunctivae normal.  Cardiovascular:     Rate and Rhythm: Normal rate and regular rhythm.     Pulses: Normal pulses.     Comments: Although she was tachycardic at the time her vitals were taken by the nurse, she has a normal rate and rhythm on my exam. Pulmonary:     Effort: Pulmonary effort is normal. No respiratory distress.     Breath sounds: Normal breath sounds.  Abdominal:     General: Bowel sounds are normal.     Palpations: Abdomen is soft.     Tenderness: There is no abdominal tenderness.     Comments: Abdomen is protuberant, consistent with  her level of obesity.  No surgical scars are seen.  Genitourinary:    Comments: Deferred Musculoskeletal:        General: No swelling or tenderness.     Cervical back: Normal range of motion and neck supple.  Lymphadenopathy:     Cervical: No cervical adenopathy.  Skin:    General: Skin is warm and dry.  Neurological:     General: No focal deficit present.     Mental Status: She is alert and oriented to person, place, and time.  Psychiatric:        Mood and Affect: Mood normal.        Behavior: Behavior normal.     Data Reviewed There are no new relevant data available for review.  Assessment This is a 39 year old woman with symptomatic cholelithiasis.  She was originally scheduled for surgery with me, but she was frightened and canceled the operation.  She has had persistent symptoms and is now interested in pursuing laparoscopic cholecystectomy  Plan I discussed the procedure in detail.  We discussed the risks and benefits of a laparoscopic cholecystectomy and possible cholangiogram including, but not limited to: bleeding, infection, injury to surrounding structures such as the intestine or liver, bile leak, retained gallstones, need to convert to an open procedure, prolonged diarrhea, blood clots such as DVT, common bile duct injury, anesthesia risks, and possible need for additional procedures. The patient had the opportunity to ask any questions and these were answered to her satisfaction.  We will work on getting her scheduled.  Michele Novak 04/12/2020, 1:23 PM

## 2020-04-12 NOTE — Progress Notes (Signed)
Patient ID: Michele Novak, female   DOB: 11/21/1981, 39 y.o.   MRN: [id number]  Chief Complaint  Patient presents with  . Follow-up    Cholelithiasis    HPI Michele Novak is a 39 y.o. female.  I initially saw her in November 2019.  My initial HPI is copied here:  "Michele Novak is a 39 y/o F referred for surgical evaluation of symptomatic cholelithiasis.  She relates 9 year history of intermittent RUQ pain. It is made worse by fatty foods, the greasier, the more severe her symptoms.  She states she is experiencing symptoms today due to having eaten tacos yesterday.  She also notes some positional exacerbation, with bending to the right making it worse.  She has never had jaundice, clay-colored stools, or pancreatitis.  Imaging has been performed in the past that showed significant gallstones, but no GB wall thickening, no pericholecystic fluid, no biliary dilation.  She is seeking surgical intervention at this time due to finally having health insurance."     She was scheduled for surgery at that time, however she says that she "chickened out."  She says that she has had continued flareups since that time.  Her symptoms are continually exacerbated by eating spicy or fatty foods.  She denies any fevers or chills.  No nausea or vomiting.  She does endorse having somewhat greasy stools but no frank diarrhea.  She states that her appetite is normal.  Due to ongoing symptoms, she is again interested in having her gallbladder removed.   Past Medical History:  Diagnosis Date  . Gallstones     Past Surgical History:  Procedure Laterality Date  . TONSILLECTOMY    . TUBAL LIGATION      Family History  Problem Relation Age of Onset  . Cancer Father        lung, stomach  . Hypertension Maternal Grandmother   . Cancer Maternal Grandmother   . Cancer Paternal Grandmother   . Breast cancer Neg Hx     Social History Social History   Tobacco Use  . Smoking status: Current Every Day Smoker   . Smokeless tobacco: Never Used  Substance Use Topics  . Alcohol use: No  . Drug use: No    No Known Allergies  Current Outpatient Medications  Medication Sig Dispense Refill  . medroxyPROGESTERone (PROVERA) 10 MG tablet Take 1 tablet (10 mg total) by mouth daily. 10 tablet 0  . meloxicam (MOBIC) 15 MG tablet Take 1 tablet (15 mg total) by mouth daily. 30 tablet 0  . naproxen (NAPROSYN) 500 MG tablet TAKE 1 TABLET BY MOUTH TWICE DAILY AS NEEDED FOR PAIN     No current facility-administered medications for this visit.    Review of Systems Review of Systems  All other systems reviewed and are negative. Or as discussed in the history of present illness.  Blood pressure 129/83, pulse (!) 106, temperature 97.9 F (36.6 C), resp. rate 12, height 5' 5" (1.651 m), weight 287 lb 12.8 oz (130.5 kg), SpO2 98 %. Body mass index is 47.89 kg/m.  Physical Exam Physical Exam Vitals reviewed.  Constitutional:      General: She is not in acute distress.    Appearance: Normal appearance. She is obese.  HENT:     Head: Normocephalic and atraumatic.     Nose:     Comments: Covered with a mask secondary to COVID-19 precautions    Mouth/Throat:     Comments: Covered with a mask secondary to COVID-19   precautions Eyes:     General: No scleral icterus.       Right eye: No discharge.        Left eye: No discharge.     Conjunctiva/sclera: Conjunctivae normal.  Cardiovascular:     Rate and Rhythm: Normal rate and regular rhythm.     Pulses: Normal pulses.     Comments: Although she was tachycardic at the time her vitals were taken by the nurse, she has a normal rate and rhythm on my exam. Pulmonary:     Effort: Pulmonary effort is normal. No respiratory distress.     Breath sounds: Normal breath sounds.  Abdominal:     General: Bowel sounds are normal.     Palpations: Abdomen is soft.     Tenderness: There is no abdominal tenderness.     Comments: Abdomen is protuberant, consistent with  her level of obesity.  No surgical scars are seen.  Genitourinary:    Comments: Deferred Musculoskeletal:        General: No swelling or tenderness.     Cervical back: Normal range of motion and neck supple.  Lymphadenopathy:     Cervical: No cervical adenopathy.  Skin:    General: Skin is warm and dry.  Neurological:     General: No focal deficit present.     Mental Status: She is alert and oriented to person, place, and time.  Psychiatric:        Mood and Affect: Mood normal.        Behavior: Behavior normal.     Data Reviewed There are no new relevant data available for review.  Assessment This is a 39 year old woman with symptomatic cholelithiasis.  She was originally scheduled for surgery with me, but she was frightened and canceled the operation.  She has had persistent symptoms and is now interested in pursuing laparoscopic cholecystectomy  Plan I discussed the procedure in detail.  We discussed the risks and benefits of a laparoscopic cholecystectomy and possible cholangiogram including, but not limited to: bleeding, infection, injury to surrounding structures such as the intestine or liver, bile leak, retained gallstones, need to convert to an open procedure, prolonged diarrhea, blood clots such as DVT, common bile duct injury, anesthesia risks, and possible need for additional procedures. The patient had the opportunity to ask any questions and these were answered to her satisfaction.  We will work on getting her scheduled.  [name] 04/12/2020, 1:23 PM

## 2020-04-12 NOTE — Patient Instructions (Signed)
Our surgery scheduler will contact you to schedule your surgery. Please have the Blue Sheet available when she calls you.   Call the office if you have any questions or concerns.  Laparoscopic Cholecystectomy Laparoscopic cholecystectomy is surgery to remove the gallbladder. The gallbladder is a pear-shaped organ that lies beneath the liver on the right side of the body. The gallbladder stores bile, which is a fluid that helps the body to digest fats. Cholecystectomy is often done for inflammation of the gallbladder (cholecystitis). This condition is usually caused by a buildup of gallstones (cholelithiasis) in the gallbladder. Gallstones can block the flow of bile, which can result in inflammation and pain. In severe cases, emergency surgery may be required. This procedure is done though small incisions in your abdomen (laparoscopic surgery). A thin scope with a camera (laparoscope) is inserted through one incision. Thin surgical instruments are inserted through the other incisions. In some cases, a laparoscopic procedure may be turned into a type of surgery that is done through a larger incision (open surgery). Tell a health care provider about:  Any allergies you have.  All medicines you are taking, including vitamins, herbs, eye drops, creams, and over-the-counter medicines.  Any problems you or family members have had with anesthetic medicines.  Any blood disorders you have.  Any surgeries you have had.  Any medical conditions you have.  Whether you are pregnant or may be pregnant. What are the risks? Generally, this is a safe procedure. However, problems may occur, including:  Infection.  Bleeding.  Allergic reactions to medicines.  Damage to other structures or organs.  A stone remaining in the common bile duct. The common bile duct carries bile from the gallbladder into the small intestine.  A bile leak from the cyst duct that is clipped when your gallbladder is  removed. What happens before the procedure? Staying hydrated Follow instructions from your health care provider about hydration, which may include:  Up to 2 hours before the procedure - you may continue to drink clear liquids, such as water, clear fruit juice, black coffee, and plain tea. Eating and drinking restrictions Follow instructions from your health care provider about eating and drinking, which may include:  8 hours before the procedure - stop eating heavy meals or foods such as meat, fried foods, or fatty foods.  6 hours before the procedure - stop eating light meals or foods, such as toast or cereal.  6 hours before the procedure - stop drinking milk or drinks that contain milk.  2 hours before the procedure - stop drinking clear liquids. Medicines  Ask your health care provider about: ? Changing or stopping your regular medicines. This is especially important if you are taking diabetes medicines or blood thinners. ? Taking medicines such as aspirin and ibuprofen. These medicines can thin your blood. Do not take these medicines before your procedure if your health care provider instructs you not to.  You may be given antibiotic medicine to help prevent infection. General instructions  Let your health care provider know if you develop a cold or an infection before surgery.  Plan to have someone take you home from the hospital or clinic.  Ask your health care provider how your surgical site will be marked or identified. What happens during the procedure?   To reduce your risk of infection: ? Your health care team will wash or sanitize their hands. ? Your skin will be washed with soap. ? Hair may be removed from the surgical area.    area.  An IV tube may be inserted into one of your veins.  You will be given one or more of the following: ? A medicine to help you relax (sedative). ? A medicine to make you fall asleep (general anesthetic).  A breathing tube will be placed in  your mouth.  Your surgeon will make several small cuts (incisions) in your abdomen.  The laparoscope will be inserted through one of the small incisions. The camera on the laparoscope will send images to a TV screen (monitor) in the operating room. This lets your surgeon see inside your abdomen.  Air-like gas will be pumped into your abdomen. This will expand your abdomen to give the surgeon more room to perform the surgery.  Other tools that are needed for the procedure will be inserted through the other incisions. The gallbladder will be removed through one of the incisions.  Your common bile duct may be examined. If stones are found in the common bile duct, they may be removed.  After your gallbladder has been removed, the incisions will be closed with stitches (sutures), staples, or skin glue.  Your incisions may be covered with a bandage (dressing). The procedure may vary among health care providers and hospitals. What happens after the procedure?  Your blood pressure, heart rate, breathing rate, and blood oxygen level will be monitored until the medicines you were given have worn off.  You will be given medicines as needed to control your pain.  Do not drive for 24 hours if you were given a sedative. This information is not intended to replace advice given to you by your health care provider. Make sure you discuss any questions you have with your health care provider. Document Revised: 11/13/2017 Document Reviewed: 05/19/2016 Elsevier Patient Education  2020 [employer]  

## 2020-04-13 ENCOUNTER — Telehealth: Payer: Self-pay | Admitting: General Surgery

## 2020-04-13 NOTE — Telephone Encounter (Signed)
Pt has been advised of Pre-Admission date/time, COVID Testing date and Surgery date.  Surgery Date: 04/27/20 Preadmission Testing Date: 04/19/20 (phone 8a-1p) Covid Testing Date: 04/25/20 - patient advised to go to [facility] ([address] [place]) between 8a-1p   Patient has been made aware to call [phone], between 1-3:00pm the day before surgery, to find out what time to arrive for surgery.    

## 2020-04-19 ENCOUNTER — Other Ambulatory Visit: Payer: Self-pay

## 2020-04-19 ENCOUNTER — Encounter
Admission: RE | Admit: 2020-04-19 | Discharge: 2020-04-19 | Disposition: A | Discharge status: Home / Self Care | Payer: Medicaid Other | Source: Ambulatory Visit | Attending: General Surgery | Admitting: General Surgery

## 2020-04-19 HISTORY — DX: Depression, unspecified: F32.A

## 2020-04-19 NOTE — Patient Instructions (Addendum)
COVID TESTING Date: Wednesday, May 12 Testing site:  [facility] [address] Thru Hours:  8:24 am - 1:00 pm Once you are tested, you are asked to stay quarantined (avoiding public places) until after your surgery.  Your procedure is scheduled on: Friday, May 14 Report to Day Surgery on the 2nd floor of the [facility] To find out your arrival time, please call [phone] between 1PM - 3PM on: Thursday, May 13  REMEMBER: Instructions that are not followed completely may result in serious medical risk, up to and including death; or upon the discretion of your surgeon and anesthesiologist your surgery may need to be rescheduled.  Do not eat food after midnight the night before surgery.  No gum chewing, lozengers or hard candies.  You may however, drink CLEAR liquids up to 2 hours before you are scheduled to arrive for your surgery. Do not drink anything within 2 hours of your scheduled arrival time.  Clear liquids include: - water  - apple juice without pulp - gatorade (not RED) - black coffee or tea (Do NOT add milk or creamers to the coffee or tea) Do NOT drink anything that is not on this list.  DO NOT TAKE ANY MEDICATIONS THE MORNING OF SURGERY  Stop Anti-inflammatories (NSAIDS) such as Advil, Aleve, Ibuprofen, Motrin, Naproxen, Naprosyn and Aspirin based products such as Excedrin, Goodys Powder, BC Powder. (May take Tylenol or Acetaminophen if needed.)  Stop ANY OVER THE COUNTER supplements until after surgery.  No Alcohol for 24 hours before or after surgery.  No Smoking including e-cigarettes for 24 hours prior to surgery.  No chewable tobacco products for at least 6 hours prior to surgery.  No nicotine patches on the day of surgery.  Do not use any "recreational" drugs for at least a week prior to your surgery.  Please be advised that the combination of cocaine and anesthesia may have negative outcomes, up to and including death. If  you test positive for cocaine, your surgery will be cancelled.  On the morning of surgery brush your teeth with toothpaste and water, you may rinse your mouth with mouthwash if you wish. Do not swallow any toothpaste or mouthwash.  Do not wear jewelry, make-up, hairpins, clips or nail polish.  Do not wear lotions, powders, or perfumes.   Do not shave 48 hours prior to surgery.   Do not bring valuables to the hospital. [facility] is not responsible for any missing/lost belongings or valuables.   Use CHG Soap as directed on instruction sheet.  Notify your doctor if there is any change in your medical condition (cold, fever, infection).  Wear comfortable clothing (specific to your surgery type) to the hospital.  Plan for stool softeners for home use; pain medications have a tendency to cause constipation. You can also help prevent constipation by eating foods high in fiber such as fruits and vegetables and drinking plenty of fluids as your diet allows.  After surgery, you can help prevent lung complications by doing breathing exercises.  Take deep breaths and cough every 1-2 hours. Your doctor may order a device called an Incentive Spirometer to help you take deep breaths. When coughing or sneezing, hold a pillow firmly against your incision with both hands. This is called "splinting." Doing this helps protect your incision. It also decreases belly discomfort.  If you are being discharged the day of surgery, you will not be allowed to drive home. You will need a responsible adult (  18 years or older) to drive you home and stay with you that night.   If you are taking public transportation, you will need to have a responsible adult (18 years or older) with you. Please confirm with your physician that it is acceptable to use public transportation.   Please call the [facility] at [phone] if you have any questions about these instructions.  Visitation  Policy:  Patients undergoing a surgery or procedure may have one family member or support person with them as long as that person is not COVID-19 positive or experiencing its symptoms.  That person may remain in the waiting area during the procedure.

## 2020-04-25 ENCOUNTER — Other Ambulatory Visit
Admission: RE | Admit: 2020-04-25 | Discharge: 2020-04-25 | Disposition: A | Discharge status: Home / Self Care | Payer: Medicaid Other | Source: Ambulatory Visit | Attending: General Surgery | Admitting: General Surgery

## 2020-04-25 DIAGNOSIS — Z20822 Contact with and (suspected) exposure to covid-19: Secondary | ICD-10-CM | POA: Insufficient documentation

## 2020-04-25 DIAGNOSIS — Z01812 Encounter for preprocedural laboratory examination: Secondary | ICD-10-CM | POA: Diagnosis not present

## 2020-04-25 LAB — SARS CORONAVIRUS 2 (TAT 6-24 HRS): SARS Coronavirus 2: NEGATIVE

## 2020-04-26 MED ORDER — DEXTROSE 5 % IV SOLN
3.0000 g | INTRAVENOUS | Status: AC
  Administered 2020-04-27: 3 g via INTRAVENOUS
  Filled 2020-04-26: qty 3

## 2020-04-27 ENCOUNTER — Other Ambulatory Visit: Payer: Self-pay

## 2020-04-27 ENCOUNTER — Encounter: Payer: Self-pay | Admitting: General Surgery

## 2020-04-27 ENCOUNTER — Ambulatory Visit
Admission: RE | Admit: 2020-04-27 | Discharge: 2020-04-27 | Disposition: A | Discharge status: Home / Self Care | Payer: Medicaid Other | Attending: General Surgery | Admitting: General Surgery

## 2020-04-27 ENCOUNTER — Ambulatory Visit: Payer: Medicaid Other | Admitting: Certified Registered Nurse Anesthetist

## 2020-04-27 ENCOUNTER — Encounter
Admission: RE | Disposition: A | Discharge status: Home / Self Care | Payer: Self-pay | Source: Home / Self Care | Attending: General Surgery

## 2020-04-27 DIAGNOSIS — F17200 Nicotine dependence, unspecified, uncomplicated: Secondary | ICD-10-CM | POA: Insufficient documentation

## 2020-04-27 DIAGNOSIS — K8020 Calculus of gallbladder without cholecystitis without obstruction: Secondary | ICD-10-CM

## 2020-04-27 DIAGNOSIS — Z793 Long term (current) use of hormonal contraceptives: Secondary | ICD-10-CM | POA: Insufficient documentation

## 2020-04-27 DIAGNOSIS — Z791 Long term (current) use of non-steroidal anti-inflammatories (NSAID): Secondary | ICD-10-CM | POA: Insufficient documentation

## 2020-04-27 DIAGNOSIS — K8010 Calculus of gallbladder with chronic cholecystitis without obstruction: Secondary | ICD-10-CM | POA: Diagnosis not present

## 2020-04-27 DIAGNOSIS — Z79899 Other long term (current) drug therapy: Secondary | ICD-10-CM | POA: Diagnosis not present

## 2020-04-27 HISTORY — PX: CHOLECYSTECTOMY: SHX55

## 2020-04-27 LAB — URINE DRUG SCREEN, QUALITATIVE (ARMC ONLY)
Amphetamines, Ur Screen: NOT DETECTED
Barbiturates, Ur Screen: NOT DETECTED
Benzodiazepine, Ur Scrn: NOT DETECTED
Cannabinoid 50 Ng, Ur ~~LOC~~: POSITIVE — AB
Cocaine Metabolite,Ur ~~LOC~~: NOT DETECTED
MDMA (Ecstasy)Ur Screen: NOT DETECTED
Methadone Scn, Ur: NOT DETECTED
Opiate, Ur Screen: NOT DETECTED
Phencyclidine (PCP) Ur S: NOT DETECTED
Tricyclic, Ur Screen: NOT DETECTED

## 2020-04-27 LAB — POCT PREGNANCY, URINE: Preg Test, Ur: NEGATIVE

## 2020-04-27 SURGERY — LAPAROSCOPIC CHOLECYSTECTOMY
Anesthesia: General | Site: Abdomen

## 2020-04-27 MED ORDER — ACETAMINOPHEN 500 MG PO TABS
ORAL_TABLET | ORAL | Status: AC
  Administered 2020-04-27: 1000 mg via ORAL
  Filled 2020-04-27: qty 2

## 2020-04-27 MED ORDER — FENTANYL CITRATE (PF) 100 MCG/2ML IJ SOLN
INTRAMUSCULAR | Status: DC | PRN
  Administered 2020-04-27 (×2): 50 ug via INTRAVENOUS

## 2020-04-27 MED ORDER — OXYCODONE HCL 5 MG PO TABS: 5.0000 mg | ORAL_TABLET | Freq: Four times a day (QID) | ORAL | 0 refills | Status: DC | PRN

## 2020-04-27 MED ORDER — OXYCODONE HCL 5 MG/5ML PO SOLN: 5.0000 mg | Freq: Once | ORAL | Status: AC | PRN

## 2020-04-27 MED ORDER — ONDANSETRON 4 MG PO TBDP
4.0000 mg | ORAL_TABLET | Freq: Three times a day (TID) | ORAL | 0 refills | Status: DC | PRN
Start: 2020-04-27 — End: 2020-05-08

## 2020-04-27 MED ORDER — MIDAZOLAM HCL 2 MG/2ML IJ SOLN
INTRAMUSCULAR | Status: AC
  Filled 2020-04-27: qty 2

## 2020-04-27 MED ORDER — ALBUTEROL SULFATE HFA 108 (90 BASE) MCG/ACT IN AERS
INHALATION_SPRAY | RESPIRATORY_TRACT | Status: AC
  Filled 2020-04-27: qty 6.7

## 2020-04-27 MED ORDER — PROMETHAZINE HCL 25 MG/ML IJ SOLN
INTRAMUSCULAR | Status: AC
  Administered 2020-04-27: 6.25 mg via INTRAVENOUS
  Filled 2020-04-27: qty 1

## 2020-04-27 MED ORDER — FAMOTIDINE 20 MG PO TABS
ORAL_TABLET | ORAL | Status: AC
  Administered 2020-04-27: 20 mg via ORAL
  Filled 2020-04-27: qty 1

## 2020-04-27 MED ORDER — CHLORHEXIDINE GLUCONATE CLOTH 2 % EX PADS: 6.0000 | MEDICATED_PAD | Freq: Once | CUTANEOUS | Status: DC

## 2020-04-27 MED ORDER — ONDANSETRON HCL 4 MG/2ML IJ SOLN
INTRAMUSCULAR | Status: DC | PRN
  Administered 2020-04-27: 4 mg via INTRAVENOUS

## 2020-04-27 MED ORDER — ALBUTEROL SULFATE HFA 108 (90 BASE) MCG/ACT IN AERS
INHALATION_SPRAY | RESPIRATORY_TRACT | Status: DC | PRN
  Administered 2020-04-27 (×2): 5 via RESPIRATORY_TRACT

## 2020-04-27 MED ORDER — AMOXICILLIN-POT CLAVULANATE 875-125 MG PO TABS
1.0000 | ORAL_TABLET | Freq: Two times a day (BID) | ORAL | 0 refills | Status: AC
Start: 2020-04-27 — End: 2020-05-02

## 2020-04-27 MED ORDER — FAMOTIDINE 20 MG PO TABS: 20.0000 mg | ORAL_TABLET | Freq: Once | ORAL | Status: AC

## 2020-04-27 MED ORDER — IBUPROFEN 800 MG PO TABS: 800.0000 mg | ORAL_TABLET | Freq: Three times a day (TID) | ORAL | 0 refills | Status: AC | PRN

## 2020-04-27 MED ORDER — LIDOCAINE-EPINEPHRINE 1 %-1:100000 IJ SOLN
INTRAMUSCULAR | Status: AC
  Filled 2020-04-27: qty 1

## 2020-04-27 MED ORDER — FENTANYL CITRATE (PF) 100 MCG/2ML IJ SOLN
25.0000 ug | INTRAMUSCULAR | Status: DC | PRN
  Administered 2020-04-27: 50 ug via INTRAVENOUS
  Administered 2020-04-27: 25 ug via INTRAVENOUS

## 2020-04-27 MED ORDER — OXYCODONE HCL 5 MG PO TABS
ORAL_TABLET | ORAL | Status: AC
  Administered 2020-04-27: 5 mg via ORAL
  Filled 2020-04-27: qty 1

## 2020-04-27 MED ORDER — LIDOCAINE HCL (PF) 2 % IJ SOLN
INTRAMUSCULAR | Status: AC
  Filled 2020-04-27: qty 5

## 2020-04-27 MED ORDER — ACETAMINOPHEN 500 MG PO TABS: 1000.0000 mg | ORAL_TABLET | ORAL | Status: AC

## 2020-04-27 MED ORDER — METOPROLOL TARTRATE 5 MG/5ML IV SOLN
INTRAVENOUS | Status: DC | PRN
  Administered 2020-04-27: 2 mg via INTRAVENOUS

## 2020-04-27 MED ORDER — CHLORHEXIDINE GLUCONATE 0.12 % MT SOLN
OROMUCOSAL | Status: AC
  Filled 2020-04-27: qty 15

## 2020-04-27 MED ORDER — OXYCODONE HCL 5 MG PO TABS: 5.0000 mg | ORAL_TABLET | Freq: Once | ORAL | Status: AC | PRN

## 2020-04-27 MED ORDER — PROPOFOL 10 MG/ML IV BOLUS
INTRAVENOUS | Status: DC | PRN
  Administered 2020-04-27: 200 mg via INTRAVENOUS

## 2020-04-27 MED ORDER — SODIUM CHLORIDE FLUSH 0.9 % IV SOLN
INTRAVENOUS | Status: AC
  Filled 2020-04-27: qty 10

## 2020-04-27 MED ORDER — ROCURONIUM BROMIDE 100 MG/10ML IV SOLN
INTRAVENOUS | Status: DC | PRN
  Administered 2020-04-27: 10 mg via INTRAVENOUS
  Administered 2020-04-27: 20 mg via INTRAVENOUS
  Administered 2020-04-27: 60 mg via INTRAVENOUS

## 2020-04-27 MED ORDER — MEPERIDINE HCL 50 MG/ML IJ SOLN: 6.2500 mg | INTRAMUSCULAR | Status: DC | PRN

## 2020-04-27 MED ORDER — CELECOXIB 200 MG PO CAPS: 200.0000 mg | ORAL_CAPSULE | ORAL | Status: AC

## 2020-04-27 MED ORDER — GLYCOPYRROLATE 0.2 MG/ML IJ SOLN
INTRAMUSCULAR | Status: DC | PRN
  Administered 2020-04-27: .8 mg via INTRAVENOUS

## 2020-04-27 MED ORDER — FENTANYL CITRATE (PF) 100 MCG/2ML IJ SOLN
INTRAMUSCULAR | Status: AC
  Administered 2020-04-27: 25 ug via INTRAVENOUS
  Filled 2020-04-27: qty 2

## 2020-04-27 MED ORDER — FENTANYL CITRATE (PF) 100 MCG/2ML IJ SOLN
INTRAMUSCULAR | Status: AC
  Filled 2020-04-27: qty 2

## 2020-04-27 MED ORDER — LACTATED RINGERS IV SOLN: INTRAVENOUS | Status: DC

## 2020-04-27 MED ORDER — PROPOFOL 10 MG/ML IV BOLUS
INTRAVENOUS | Status: AC
  Filled 2020-04-27: qty 40

## 2020-04-27 MED ORDER — GABAPENTIN 300 MG PO CAPS
ORAL_CAPSULE | ORAL | Status: AC
  Administered 2020-04-27: 300 mg via ORAL
  Filled 2020-04-27: qty 1

## 2020-04-27 MED ORDER — LIDOCAINE HCL (CARDIAC) PF 100 MG/5ML IV SOSY
PREFILLED_SYRINGE | INTRAVENOUS | Status: DC | PRN
  Administered 2020-04-27: 100 mg via INTRAVENOUS

## 2020-04-27 MED ORDER — CELECOXIB 200 MG PO CAPS
ORAL_CAPSULE | ORAL | Status: AC
  Administered 2020-04-27: 200 mg via ORAL
  Filled 2020-04-27: qty 1

## 2020-04-27 MED ORDER — PROMETHAZINE HCL 25 MG/ML IJ SOLN: 6.2500 mg | INTRAMUSCULAR | Status: DC | PRN

## 2020-04-27 MED ORDER — NEOSTIGMINE METHYLSULFATE 10 MG/10ML IV SOLN
INTRAVENOUS | Status: DC | PRN
  Administered 2020-04-27: 4 mg via INTRAVENOUS

## 2020-04-27 MED ORDER — BUPIVACAINE HCL (PF) 0.25 % IJ SOLN
INTRAMUSCULAR | Status: AC
  Filled 2020-04-27: qty 30

## 2020-04-27 MED ORDER — LIDOCAINE-EPINEPHRINE 1 %-1:100000 IJ SOLN
INTRAMUSCULAR | Status: DC | PRN
  Administered 2020-04-27: 20 mL

## 2020-04-27 MED ORDER — DEXAMETHASONE SODIUM PHOSPHATE 10 MG/ML IJ SOLN
INTRAMUSCULAR | Status: DC | PRN
  Administered 2020-04-27: 6 mg via INTRAVENOUS

## 2020-04-27 MED ORDER — ROCURONIUM BROMIDE 10 MG/ML (PF) SYRINGE
PREFILLED_SYRINGE | INTRAVENOUS | Status: AC
  Filled 2020-04-27: qty 10

## 2020-04-27 MED ORDER — MIDAZOLAM HCL 2 MG/2ML IJ SOLN
INTRAMUSCULAR | Status: DC | PRN
  Administered 2020-04-27: 2 mg via INTRAVENOUS

## 2020-04-27 MED ORDER — ORAL CARE MOUTH RINSE: 15.0000 mL | Freq: Once | OROMUCOSAL | Status: AC

## 2020-04-27 MED ORDER — GABAPENTIN 300 MG PO CAPS: 300.0000 mg | ORAL_CAPSULE | ORAL | Status: AC

## 2020-04-27 MED ORDER — CHLORHEXIDINE GLUCONATE 0.12 % MT SOLN
15.0000 mL | Freq: Once | OROMUCOSAL | Status: AC
  Administered 2020-04-27: 15 mL via OROMUCOSAL

## 2020-04-27 SURGICAL SUPPLY — 48 items
APPLIER CLIP 5 13 M/L LIGAMAX5 (MISCELLANEOUS) ×2
BLADE SURG SZ11 CARB STEEL (BLADE) ×2 IMPLANT
CANISTER SUCT 1200ML W/VALVE (MISCELLANEOUS) ×2 IMPLANT
CATH CHOLANGI 4FR 420404F (CATHETERS) IMPLANT
CHLORAPREP W/TINT 26 (MISCELLANEOUS) ×2 IMPLANT
CLIP APPLIE 5 13 M/L LIGAMAX5 (MISCELLANEOUS) ×1 IMPLANT
COVER WAND RF STERILE (DRAPES) ×2 IMPLANT
DECANTER SPIKE VIAL GLASS SM (MISCELLANEOUS) ×4 IMPLANT
DEFOGGER SCOPE WARMER CLEARIFY (MISCELLANEOUS) ×2 IMPLANT
DERMABOND ADVANCED (GAUZE/BANDAGES/DRESSINGS) ×1
DERMABOND ADVANCED .7 DNX12 (GAUZE/BANDAGES/DRESSINGS) ×1 IMPLANT
ELECT CAUTERY BLADE TIP 2.5 (TIP) ×2
ELECT REM PT RETURN 9FT ADLT (ELECTROSURGICAL) ×2
ELECTRODE CAUTERY BLDE TIP 2.5 (TIP) ×1 IMPLANT
ELECTRODE REM PT RTRN 9FT ADLT (ELECTROSURGICAL) ×1 IMPLANT
GLOVE BIO SURGEON STRL SZ 6.5 (GLOVE) ×4 IMPLANT
GLOVE INDICATOR 7.0 STRL GRN (GLOVE) ×2 IMPLANT
GOWN STRL REUS W/ TWL LRG LVL3 (GOWN DISPOSABLE) ×3 IMPLANT
GOWN STRL REUS W/TWL LRG LVL3 (GOWN DISPOSABLE) ×6
GRASPER SUT TROCAR 14GX15 (MISCELLANEOUS) IMPLANT
IRRIGATION STRYKERFLOW (MISCELLANEOUS) ×1 IMPLANT
IRRIGATOR STRYKERFLOW (MISCELLANEOUS) ×2
IV CATH ANGIO 12GX3 LT BLUE (NEEDLE) IMPLANT
IV NS 1000ML (IV SOLUTION) ×2
IV NS 1000ML BAXH (IV SOLUTION) ×1 IMPLANT
KIT TURNOVER KIT A (KITS) ×2 IMPLANT
KITTNER LAPARASCOPIC 5X40 (MISCELLANEOUS) IMPLANT
LABEL OR SOLS (LABEL) ×2 IMPLANT
NEEDLE HYPO 22GX1.5 SAFETY (NEEDLE) ×2 IMPLANT
NS IRRIG 500ML POUR BTL (IV SOLUTION) ×2 IMPLANT
PACK LAP CHOLECYSTECTOMY (MISCELLANEOUS) ×2 IMPLANT
PENCIL ELECTRO HAND CTR (MISCELLANEOUS) ×2 IMPLANT
POUCH SPECIMEN RETRIEVAL 10MM (ENDOMECHANICALS) ×2 IMPLANT
SCISSORS METZENBAUM CVD 33 (INSTRUMENTS) ×2 IMPLANT
SET TUBE SMOKE EVAC HIGH FLOW (TUBING) ×2 IMPLANT
SLEEVE ADV FIXATION 5X100MM (TROCAR) ×4 IMPLANT
SOLUTION ELECTROLUBE (MISCELLANEOUS) ×2 IMPLANT
STRIP CLOSURE SKIN 1/2X4 (GAUZE/BANDAGES/DRESSINGS) ×2 IMPLANT
SUT MNCRL 4-0 (SUTURE)
SUT MNCRL 4-0 27XMFL (SUTURE)
SUT VIC AB 3-0 SH 27 (SUTURE) ×4
SUT VIC AB 3-0 SH 27X BRD (SUTURE) ×2 IMPLANT
SUT VICRYL 0 AB UR-6 (SUTURE) ×2 IMPLANT
SUTURE MNCRL 4-0 27XMF (SUTURE) IMPLANT
SYS KII FIOS ACCESS ABD 5X100 (TROCAR) ×2
SYSTEM KII FIOS ACES ABD 5X100 (TROCAR) ×1 IMPLANT
TROCAR ADV FIXATION 12X100MM (TROCAR) ×2 IMPLANT
WATER STERILE IRR 1000ML POUR (IV SOLUTION) ×2 IMPLANT

## 2020-04-27 NOTE — Op Note (Signed)
Laparoscopic Cholecystectomy  Pre-operative Diagnosis: Symptomatic cholelithiasis  Post-operative Diagnosis: Same, with chronic cholecystitis  Procedure: Laparoscopic cholecystectomy  Surgeon: Duanne Guess, MD  Anesthesia: GETA  Assistant: None   Findings: The gallbladder was completely full of stones.  The wall was thickened, consistent with chronic cholecystitis.   Estimated Blood Loss: Less than 5 cc         Drains: None         Specimens: Gallbladder           Complications: none   Procedure Details  The patient was seen again in the preoperative holding area. The benefits, complications, treatment options, and expected outcomes were discussed with the patient. The risks of bleeding, infection, recurrence of symptoms, failure to resolve symptoms, bile duct damage, bile duct leak, retained common bile duct stone, bowel injury, any of which could require further surgery and/or ERCP, stent, or papillotomy were reviewed with the patient. The likelihood of improving the patient's symptoms with return to their baseline status is good.  The patient and/or family concurred with the proposed plan, giving informed consent.  The patient was taken to operating room, identified as Michele Novak and the procedure verified as Laparoscopic Cholecystectomy. A time out was performed and the above information confirmed.  Prior to the induction of general anesthesia, antibiotic prophylaxis was administered. VTE prophylaxis was in place. General endotracheal anesthesia was then administered and tolerated well. After the induction, the abdomen was prepped with Chloraprep and draped in the sterile fashion. The patient was positioned in the supine position.  Optiview technique was used to enter the abdominal cavity in the right upper quadrant with a 5 mm trocar.  Pneumoperitoneum was then created with CO2 and tolerated well without any adverse changes in the patient's vital signs.  A 10 mm  supraumbilical port, along with 2 additional right upper quadrant 5-mm ports were placed, all under direct vision. All skin incisions  were infiltrated with a local anesthetic agent before making the incision and placing the trocars.   The patient was positioned  in reverse Trendelenburg, tilted slightly to the patient's left.  The gallbladder was identified, the fundus grasped and retracted cephalad.  The wall was very thick and difficult to grasp.  I attempted to aspirate bile, however it turned out that the gallbladder was simply chock full of stones.  Adhesions were lysed bluntly. The infundibulum was grasped and retracted laterally, exposing the peritoneum overlying the triangle of Calot. This was then divided and exposed in a blunt fashion. An extended critical view of the cystic duct and cystic artery was obtained.  The cystic duct was clearly identified and bluntly dissected free. Both the cystic artery and duct were double clipped and divided.  The gallbladder was taken from the gallbladder fossa in a retrograde fashion with the electrocautery. The gallbladder was removed and placed in an Endo pouch bag. The liver bed was irrigated and inspected. Hemostasis was achieved with the electrocautery. Copious saline irrigation was utilized and was repeatedly aspirated until clear.  The gallbladder and Endo pouch sac were then removed through a port site.   Inspection of the right upper quadrant was performed. No bleeding, bile duct injury or leak, or bowel injury was noted. Pneumoperitoneum was released.  The periumbilical port site was closed with interrumpted 0 Vicryl sutures. 4-0 subcuticular Monocryl was used to close the skin. Dermabond was applied, followed by Steri-Strips.  The patient was then extubated and brought to the recovery room in stable condition. Sponge, lap,  and needle counts were correct at closure and at the conclusion of the case.               Fredirick Maudlin, MD, FACS

## 2020-04-27 NOTE — Anesthesia Preprocedure Evaluation (Signed)
Anesthesia Evaluation  Patient identified by MRN, date of birth, ID band Patient awake    Reviewed: Allergy & Precautions, NPO status , Patient's Chart, lab work & pertinent test results  History of Anesthesia Complications Negative for: history of anesthetic complications  Airway Mallampati: III  TM Distance: >3 FB Neck ROM: Full    Dental no notable dental hx.    Pulmonary neg sleep apnea, neg COPD, Current Smoker and Patient abstained from smoking.,    breath sounds clear to auscultation- rhonchi (-) wheezing      Cardiovascular Exercise Tolerance: Good (-) hypertension(-) CAD, (-) Past MI, (-) Cardiac Stents and (-) CABG  Rhythm:Regular Rate:Normal - Systolic murmurs and - Diastolic murmurs    Neuro/Psych neg Seizures PSYCHIATRIC DISORDERS Depression negative neurological ROS     GI/Hepatic negative GI ROS, Neg liver ROS,   Endo/Other  negative endocrine ROSneg diabetes  Renal/GU negative Renal ROS     Musculoskeletal negative musculoskeletal ROS (+)   Abdominal (+) + obese,   Peds  Hematology negative hematology ROS (+)   Anesthesia Other Findings Past Medical History: No date: Depression 2020: Fracture, fibula     Comment:  right (casted) No date: Gallstones   Reproductive/Obstetrics                             Anesthesia Physical Anesthesia Plan  ASA: II  Anesthesia Plan: General   Post-op Pain Management:    Induction: Intravenous  PONV Risk Score and Plan: 1 and Ondansetron, Dexamethasone and Midazolam  Airway Management Planned: Oral ETT  Additional Equipment:   Intra-op Plan:   Post-operative Plan: Extubation in OR  Informed Consent: I have reviewed the patients History and Physical, chart, labs and discussed the procedure including the risks, benefits and alternatives for the proposed anesthesia with the patient or authorized representative who has indicated  his/her understanding and acceptance.     Dental advisory given  Plan Discussed with: CRNA and Anesthesiologist  Anesthesia Plan Comments:         Anesthesia Quick Evaluation

## 2020-04-27 NOTE — Interval H&P Note (Signed)
History and Physical Interval Note:  04/27/2020 7:13 AM  Michele Novak  has presented today for surgery, with the diagnosis of Sx Cholelithiasis.  The various methods of treatment have been discussed with the patient and family. After consideration of risks, benefits and other options for treatment, the patient has consented to  Procedure(s): LAPAROSCOPIC CHOLECYSTECTOMY (N/A) as a surgical intervention.  The patient's history has been reviewed, patient examined, no change in status, stable for surgery.  I have reviewed the patient's chart and labs.  Questions were answered to the patient's satisfaction.     Duanne Guess

## 2020-04-27 NOTE — Transfer of Care (Signed)
Immediate Anesthesia Transfer of Care Note  Patient: Michele Novak  Procedure(s) Performed: LAPAROSCOPIC CHOLECYSTECTOMY (N/A Abdomen)  Patient Location: PACU  Anesthesia Type:General  Level of Consciousness: awake, alert  and oriented  Airway & Oxygen Therapy: Patient Spontanous Breathing and Patient connected to face mask oxygen  Post-op Assessment: Report given to RN and Post -op Vital signs reviewed and stable  Post vital signs: Reviewed and stable  Last Vitals:  Vitals Value Taken Time  BP 105/82 04/27/20 0920  Temp 36.4 C 04/27/20 0920  Pulse 85 04/27/20 0928  Resp 13 04/27/20 0928  SpO2 95 % 04/27/20 0928  Vitals shown include unvalidated device data.  Last Pain:  Vitals:   04/27/20 0925  TempSrc:   PainSc: (P) 10-Worst pain ever         Complications: No apparent anesthesia complications

## 2020-04-27 NOTE — Anesthesia Procedure Notes (Signed)
Procedure Name: Intubation Date/Time: 04/27/2020 7:50 AM Performed by: Babs Sciara, CRNA Pre-anesthesia Checklist: Patient identified, Emergency Drugs available, Suction available, Patient being monitored and Timeout performed Patient Re-evaluated:Patient Re-evaluated prior to induction Oxygen Delivery Method: Circle system utilized Preoxygenation: Pre-oxygenation with 100% oxygen Induction Type: IV induction Ventilation: Mask ventilation without difficulty Laryngoscope Size: Mac and 3 Grade View: Grade I Tube type: Oral Tube size: 7.5 mm Number of attempts: 1 Airway Equipment and Method: Stylet Placement Confirmation: ETT inserted through vocal cords under direct vision,  positive ETCO2 and breath sounds checked- equal and bilateral Secured at: 21 (lip) cm Tube secured with: Tape Dental Injury: Teeth and Oropharynx as per pre-operative assessment

## 2020-04-27 NOTE — Anesthesia Postprocedure Evaluation (Signed)
Anesthesia Post Note  Patient: Michele Novak  Procedure(s) Performed: LAPAROSCOPIC CHOLECYSTECTOMY (N/A Abdomen)  Patient location during evaluation: PACU Anesthesia Type: General Level of consciousness: awake and alert and oriented Pain management: pain level controlled Vital Signs Assessment: post-procedure vital signs reviewed and stable Respiratory status: spontaneous breathing, nonlabored ventilation and respiratory function stable Cardiovascular status: blood pressure returned to baseline and stable Postop Assessment: no signs of nausea or vomiting Anesthetic complications: no     Last Vitals:  Vitals:   04/27/20 0948 04/27/20 1016  BP: 104/62 (!) 119/58  Pulse:  74  Resp: 18 16  Temp: (!) 36.2 C (!) 36.3 C  SpO2: 92% 95%    Last Pain:  Vitals:   04/27/20 1016  TempSrc: Temporal  PainSc: 6                  Tanylah Schnoebelen

## 2020-04-27 NOTE — Discharge Instructions (Signed)

## 2020-04-30 LAB — SURGICAL PATHOLOGY

## 2020-05-08 ENCOUNTER — Other Ambulatory Visit: Payer: Self-pay

## 2020-05-08 ENCOUNTER — Ambulatory Visit (INDEPENDENT_AMBULATORY_CARE_PROVIDER_SITE_OTHER): Payer: Self-pay | Admitting: Physician Assistant

## 2020-05-08 ENCOUNTER — Encounter: Payer: Self-pay | Admitting: Physician Assistant

## 2020-05-08 VITALS — BP 153/92 | HR 85 | Temp 97.0°F | Resp 15 | Ht 65.0 in | Wt 282.8 lb

## 2020-05-08 DIAGNOSIS — Z09 Encounter for follow-up examination after completed treatment for conditions other than malignant neoplasm: Secondary | ICD-10-CM

## 2020-05-08 DIAGNOSIS — K8020 Calculus of gallbladder without cholecystitis without obstruction: Secondary | ICD-10-CM

## 2020-05-08 NOTE — Patient Instructions (Addendum)
Follow up as needed, call the office with any questions or concerns.  GENERAL POST-OPERATIVE PATIENT INSTRUCTIONS   WOUND CARE INSTRUCTIONS:  Keep a dry clean dressing on the wound if there is drainage. The initial bandage may be removed after 24 hours.  Once the wound has quit draining you may leave it open to air.  If clothing rubs against the wound or causes irritation and the wound is not draining you may cover it with a dry dressing during the daytime.  Try to keep the wound dry and avoid ointments on the wound unless directed to do so.  If the wound becomes bright red and painful or starts to drain infected material that is not clear, please contact your physician immediately.  If the wound is mildly pink and has a thick firm ridge underneath it, this is normal, and is referred to as a healing ridge.  This will resolve over the next 4-6 weeks.  BATHING: You may shower if you have been informed of this by your surgeon. However, Please do not submerge in a tub, hot tub, or pool until incisions are completely sealed or have been told by your surgeon that you may do so.  DIET:  You may eat any foods that you can tolerate.  It is a good idea to eat a high fiber diet and take in plenty of fluids to prevent constipation.  If you do become constipated you may want to take a mild laxative or take ducolax tablets on a daily basis until your bowel habits are regular.  Constipation can be very uncomfortable, along with straining, after recent surgery.  ACTIVITY:  You are encouraged to cough and deep breath or use your incentive spirometer if you were given one, every 15-30 minutes when awake.  This will help prevent respiratory complications and low grade fevers post-operatively if you had a general anesthetic.  You may want to hug a pillow when coughing and sneezing to add additional support to the surgical area, if you had abdominal or chest surgery, which will decrease pain during these times.  You are  encouraged to walk and engage in light activity for the next two weeks.  You should not lift more than 20 pounds for 6 weeks after surgery as it could put you at increased risk for complications.  Twenty pounds is roughly equivalent to a plastic bag of groceries. At that time- Listen to your body when lifting, if you have pain when lifting, stop and then try again in a few days. Soreness after doing exercises or activities of daily living is normal as you get back in to your normal routine.  MEDICATIONS:  Try to take narcotic medications and anti-inflammatory medications, such as tylenol, ibuprofen, naprosyn, etc., with food.  This will minimize stomach upset from the medication.  Should you develop nausea and vomiting from the pain medication, or develop a rash, please discontinue the medication and contact your physician.  You should not drive, make important decisions, or operate machinery when taking narcotic pain medication.  SUNBLOCK Use sun block to incision area over the next year if this area will be exposed to sun. This helps decrease scarring and will allow you avoid a permanent darkened area over your incision.  QUESTIONS:  Please feel free to call our office if you have any questions, and we will be glad to assist you.

## 2020-05-08 NOTE — Progress Notes (Signed)
[facility] POST-OP OFFICE VISIT  05/08/2020  HPI: Michele Novak is a 39 y.o. female 11 days s/p laparoscopic cholecystectomy for symptomatic cholelithiasis with Dr Lady Gary  Overall doing well She had some pain and nausea the first 3 das post-op but this resolved No longer with any pain issues No fever, chills, nausea, emesis, or bowel irregularity Se is tolerating PO No issues with incisions Back to work   Vital signs: BP (!) 153/92   Pulse 85   Temp (!) 97 F (36.1 C)   Resp 15   Ht 5\' 5"  (1.651 m)   Wt 282 lb 12.8 oz (128.3 kg)   LMP 04/02/2020   SpO2 97%   BMI 47.06 kg/m    Physical Exam: Constitutional: Well appearing female, NAD Abdomen: Soft, non-tender, non-distended, no rebound/guarding Skin: Laparoscopic incisions are well healed, steri-strips have fallen off  Assessment/Plan: This is a 39 y.o. female 11 days s/p laparoscopic cholecystectomy for symptomatic cholelithiasis   - Pain control prn; no issues with pain currently  - Reviewed wound care; wounds are well healed  - Reviewed lifting restrictions; 2 more weeks, nothing more than 15-20 lbs   - Reviewed pathology: CCC, negative for malignancy  - rtc prn  -- 39, PA-C [facility] 05/08/2020, 10:21 AM [phone] M-F: 7am - 4pm

## 2021-08-18 IMAGING — US US ABDOMEN LIMITED
1 series · 10 of 10 positions shown · non-contrast
Comparison: CT dated 09/11/2017

CLINICAL DATA: Left upper quadrant soft tissue mass

EXAM:
ULTRASOUND ABDOMEN LIMITED

[Series 1: us abdomen limited · 10 of 10 slices shown]
[im 1/10]
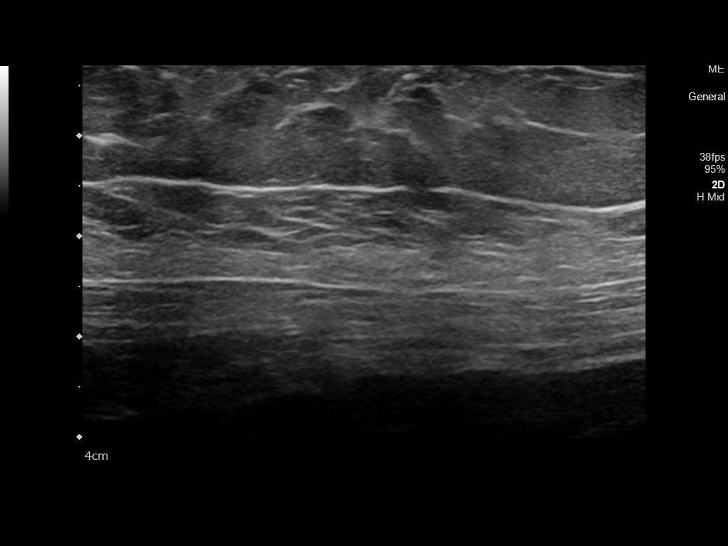
[im 2/10]
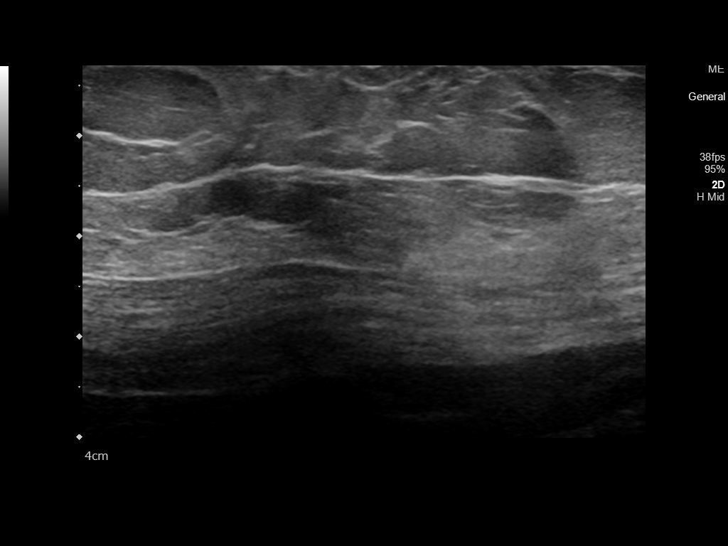
[im 3/10]
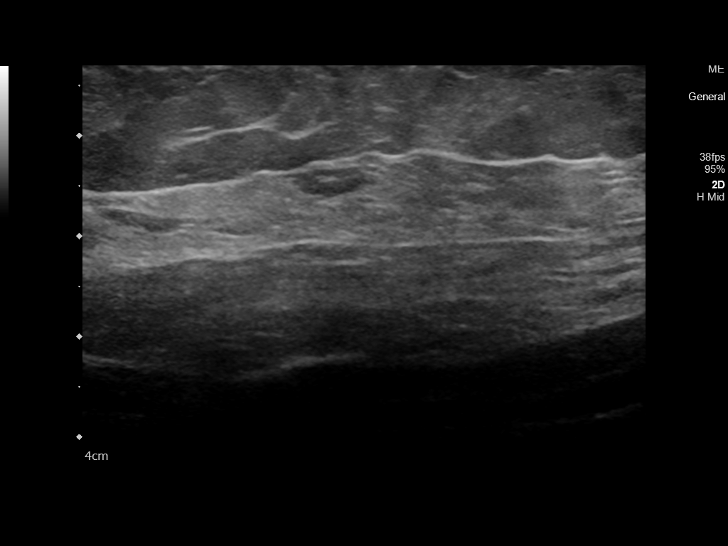
[im 4/10]
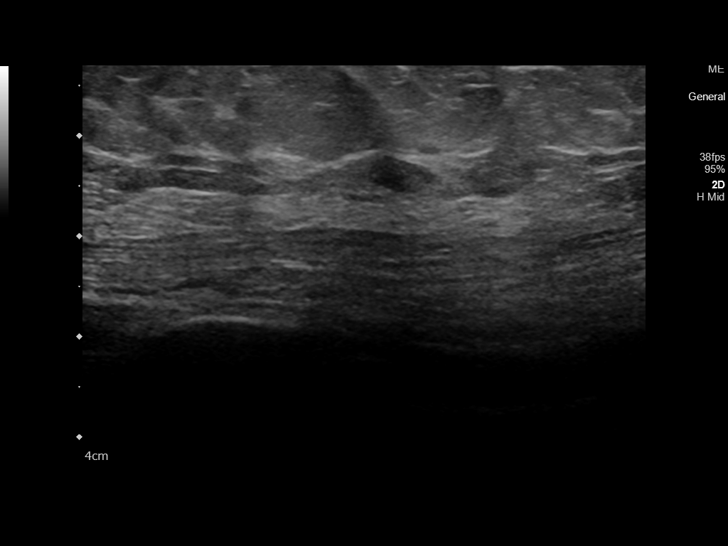
[im 5/10]
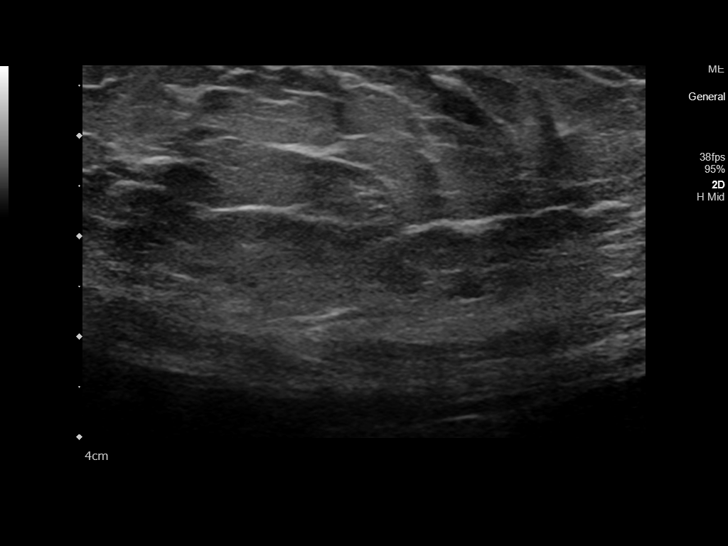
[im 6/10]
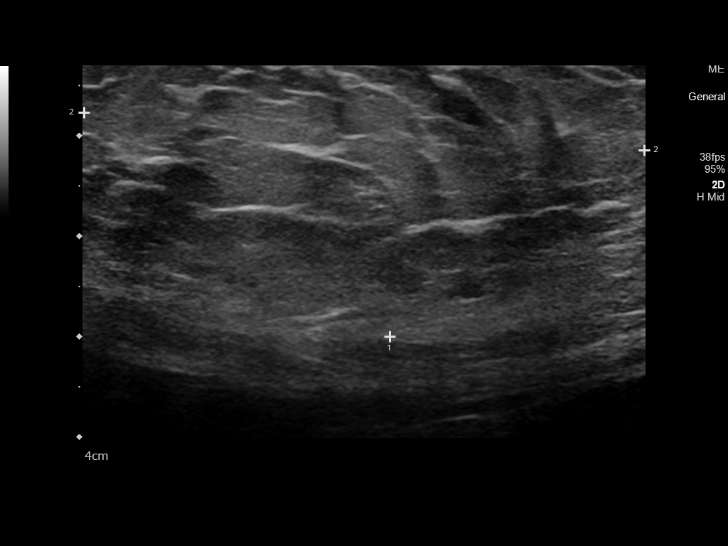
[im 7/10]
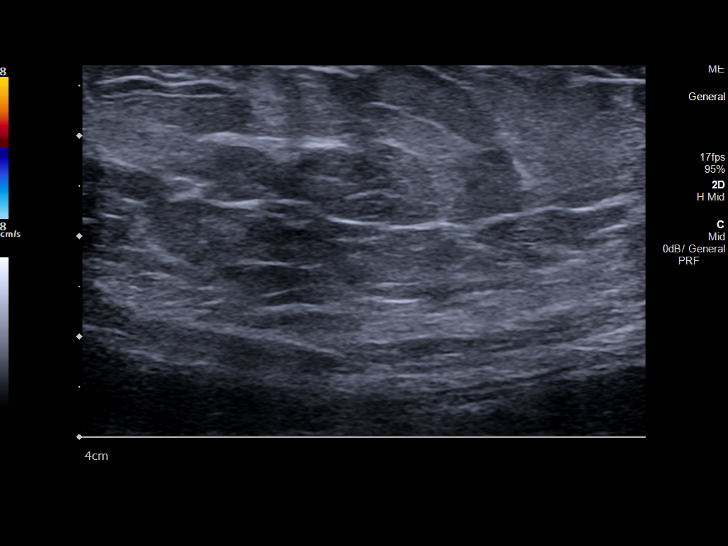
[im 8/10]
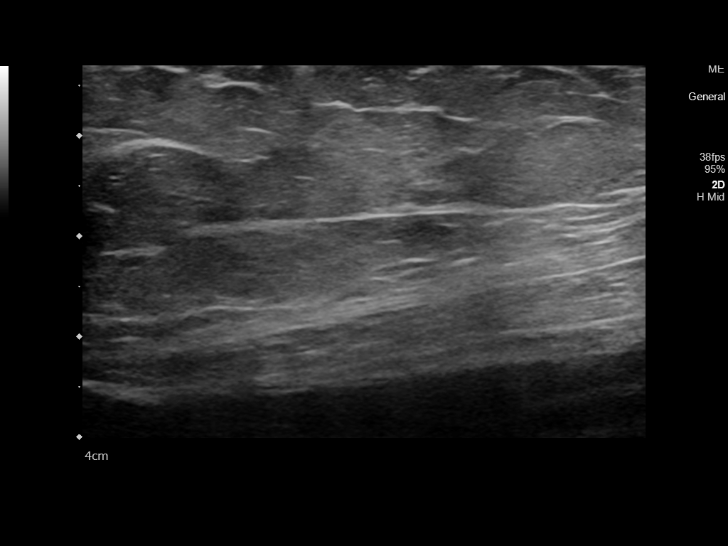
[im 9/10]
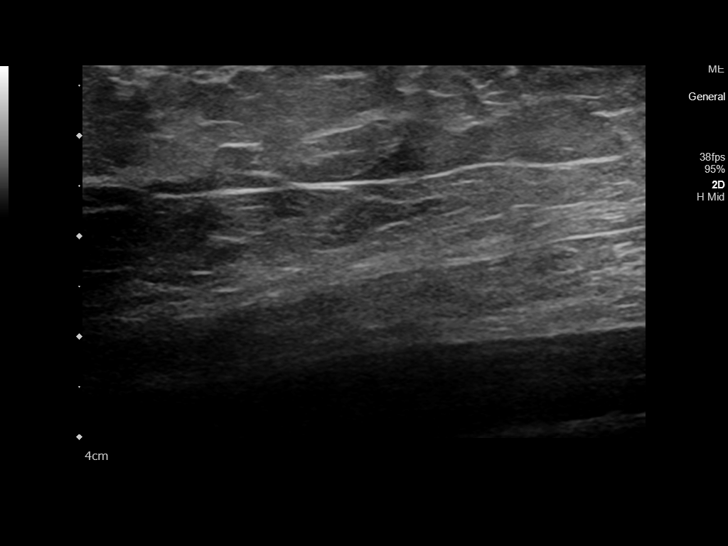
[im 10/10]
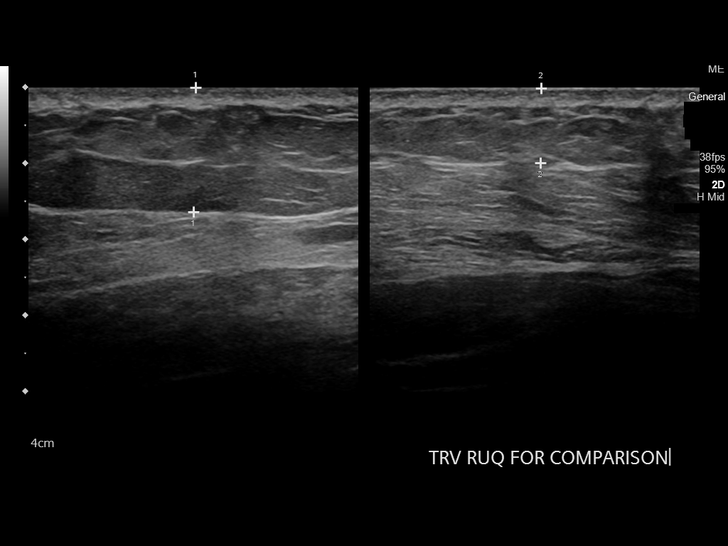

[10 of 10 positions shown; findings below may reference images not displayed]

FINDINGS: In the patient's palpable area of concern in the left upper
quadrant, there is no sonographic abnormality such as a soft tissue
mass. As seen on ultrasound and prior CT, there is slightly more
subcutaneous fat in the patient's palpable area of concern.
IMPRESSION: No sonographic abnormality detected in the patient's palpable area
of concern.

## 2021-09-11 ENCOUNTER — Encounter: Payer: Self-pay | Admitting: General Surgery

## 2021-10-09 ENCOUNTER — Other Ambulatory Visit: Payer: Self-pay | Admitting: Family Medicine

## 2021-10-09 DIAGNOSIS — Z1231 Encounter for screening mammogram for malignant neoplasm of breast: Secondary | ICD-10-CM

## 2022-01-23 ENCOUNTER — Other Ambulatory Visit: Payer: Self-pay | Admitting: Family Medicine

## 2022-01-23 DIAGNOSIS — N6310 Unspecified lump in the right breast, unspecified quadrant: Secondary | ICD-10-CM

## 2022-02-05 ENCOUNTER — Other Ambulatory Visit: Payer: Self-pay

## 2022-02-05 ENCOUNTER — Ambulatory Visit
Admission: RE | Admit: 2022-02-05 | Discharge: 2022-02-05 | Disposition: A | Discharge status: Home / Self Care | Payer: Medicaid Other | Source: Ambulatory Visit | Attending: Family Medicine | Admitting: Family Medicine

## 2022-02-05 DIAGNOSIS — N6002 Solitary cyst of left breast: Secondary | ICD-10-CM | POA: Diagnosis not present

## 2022-02-05 DIAGNOSIS — N6310 Unspecified lump in the right breast, unspecified quadrant: Secondary | ICD-10-CM | POA: Diagnosis present

## 2022-02-13 ENCOUNTER — Other Ambulatory Visit: Payer: Self-pay | Admitting: Family Medicine

## 2022-02-13 DIAGNOSIS — N63 Unspecified lump in unspecified breast: Secondary | ICD-10-CM

## 2022-02-13 DIAGNOSIS — N61 Mastitis without abscess: Secondary | ICD-10-CM

## 2022-02-13 DIAGNOSIS — R928 Other abnormal and inconclusive findings on diagnostic imaging of breast: Secondary | ICD-10-CM

## 2022-02-26 ENCOUNTER — Other Ambulatory Visit: Payer: Self-pay

## 2022-02-26 ENCOUNTER — Ambulatory Visit
Admission: RE | Admit: 2022-02-26 | Discharge: 2022-02-26 | Disposition: A | Discharge status: Home / Self Care | Payer: Medicaid Other | Source: Ambulatory Visit | Attending: Family Medicine | Admitting: Family Medicine

## 2022-02-26 DIAGNOSIS — R928 Other abnormal and inconclusive findings on diagnostic imaging of breast: Secondary | ICD-10-CM | POA: Insufficient documentation

## 2022-02-26 DIAGNOSIS — N61 Mastitis without abscess: Secondary | ICD-10-CM | POA: Insufficient documentation

## 2022-02-26 DIAGNOSIS — N63 Unspecified lump in unspecified breast: Secondary | ICD-10-CM | POA: Insufficient documentation

## 2022-02-26 MED ORDER — SULFAMETHOXAZOLE-TRIMETHOPRIM 800-160 MG PO TABS: 1.0000 | ORAL_TABLET | Freq: Two times a day (BID) | ORAL | 0 refills | Status: AC

## 2022-02-27 LAB — SURGICAL PATHOLOGY

## 2022-03-13 ENCOUNTER — Other Ambulatory Visit: Payer: Self-pay | Admitting: Nurse Practitioner

## 2022-03-13 DIAGNOSIS — L723 Sebaceous cyst: Secondary | ICD-10-CM

## 2022-03-24 ENCOUNTER — Other Ambulatory Visit: Payer: Medicaid Other

## 2022-08-19 ENCOUNTER — Ambulatory Visit: Payer: Medicaid Other

## 2022-08-19 ENCOUNTER — Ambulatory Visit (LOCAL_COMMUNITY_HEALTH_CENTER): Payer: Medicaid Other

## 2022-08-19 DIAGNOSIS — Z23 Encounter for immunization: Secondary | ICD-10-CM

## 2022-08-19 DIAGNOSIS — Z719 Counseling, unspecified: Secondary | ICD-10-CM

## 2022-08-19 NOTE — Progress Notes (Signed)
Pt in clinic for Tdap and MMR vaccines requires for school. [facility] showing one MMR vaccine received. Pt went to school in [place] and advised to request vaccine record there. Pt decided to get tdap today and will update nursing staff for her vaccine record from school. Administered tdap to right deltoid, tolerated. Given copy of [employer] and VIS. M.Millie Forde, lpn.

## 2024-01-29 IMAGING — MG MM BREAST BX W LOC DEV 1ST LESION IMAGE BX SPEC STEREO GUIDE*L*
8 of 10 series · 8 of 22 positions shown · non-contrast
Comparison: Previous exams.
COMPARISON: Previous exams.

Addendum:
CLINICAL DATA: Indeterminate LEFT breast asymmetry

EXAM:
LEFT BREAST STEREOTACTIC CORE NEEDLE BIOPSY

[L (1 of 6)]
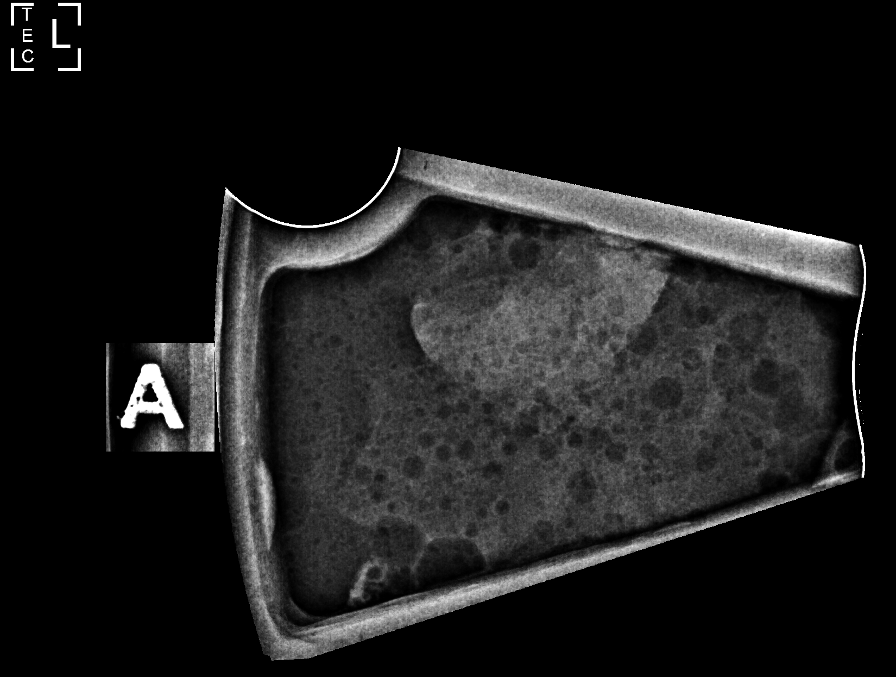

[L (2 of 6)]
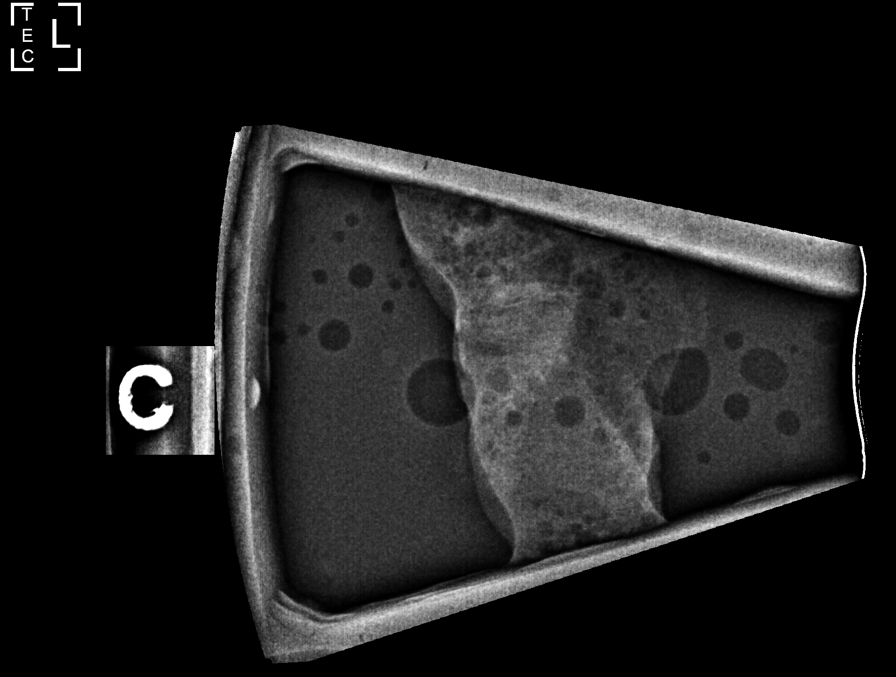

[L (3 of 6)]
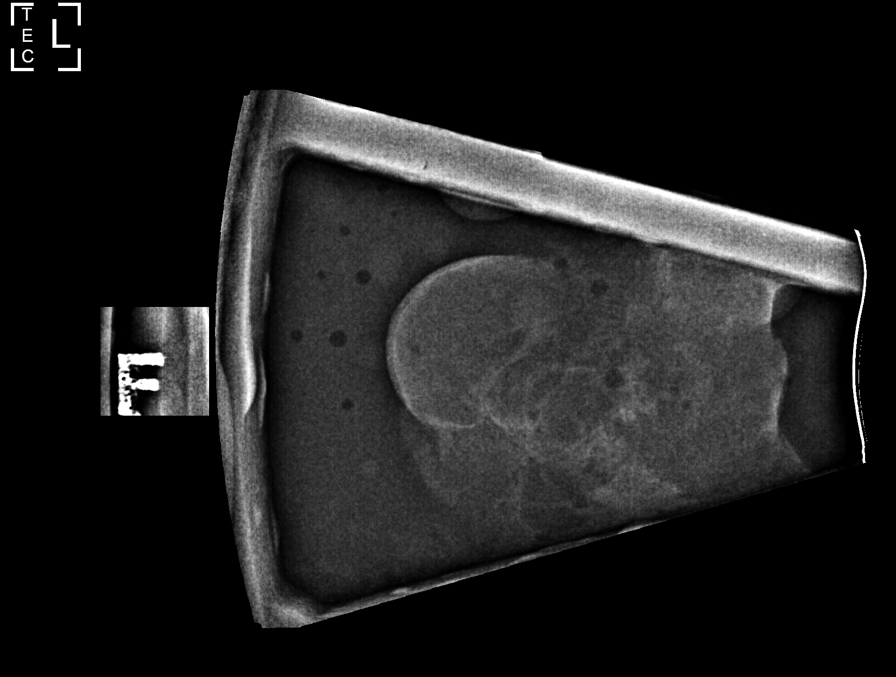

[L (4 of 6)]
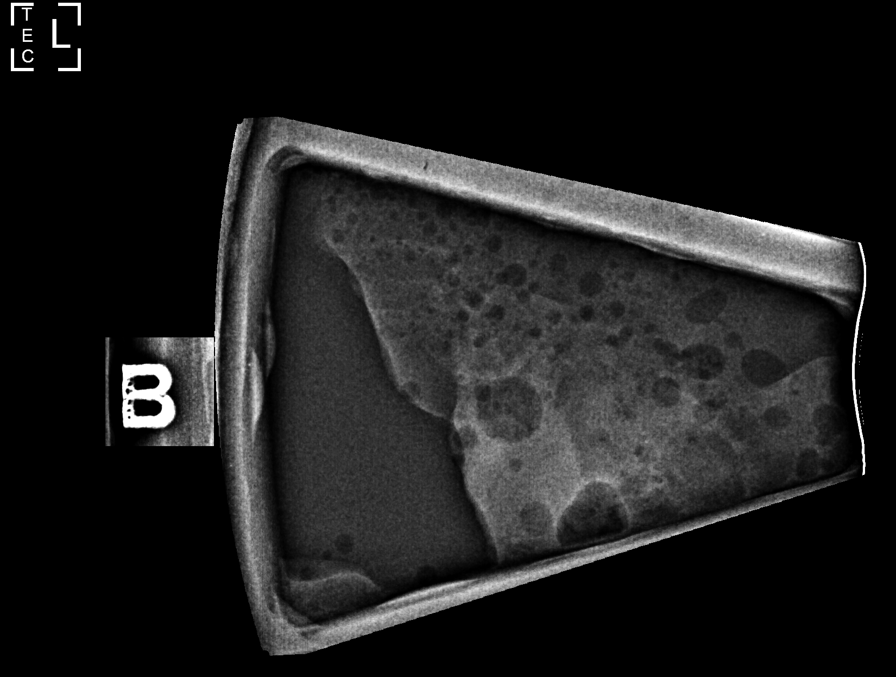

[L (5 of 6)]
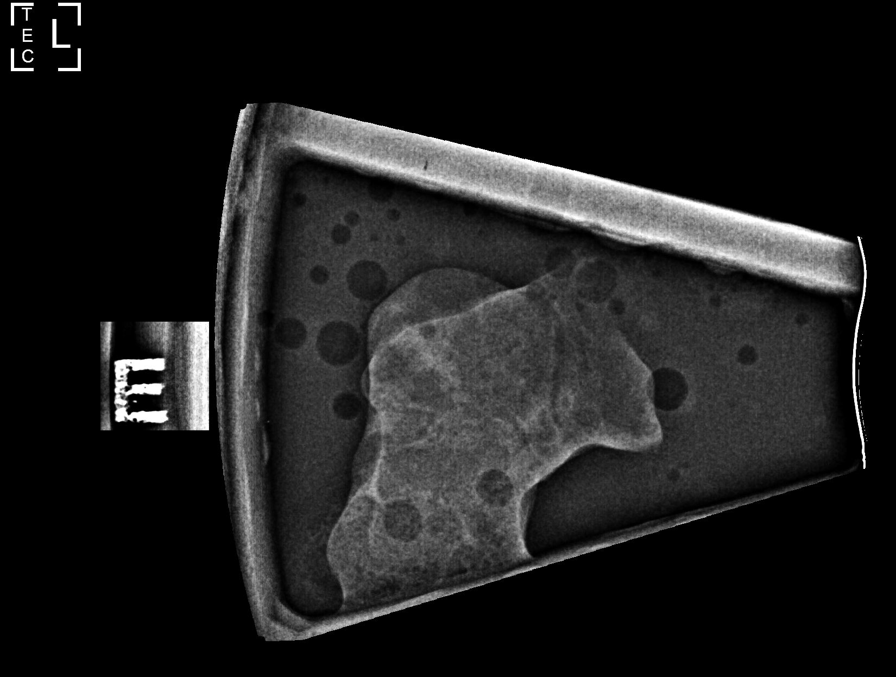

[L (6 of 6)]
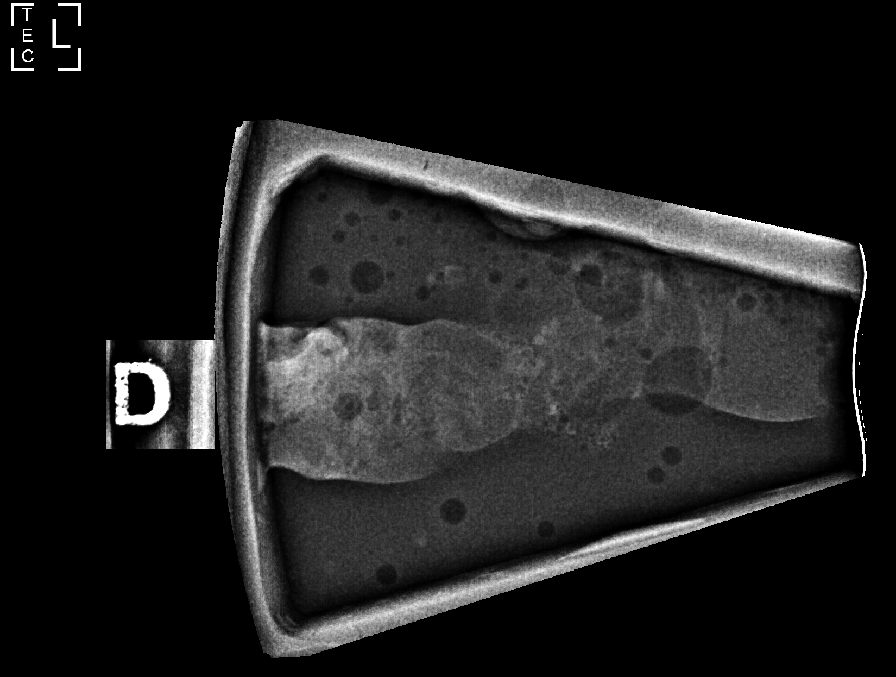

[L FB]
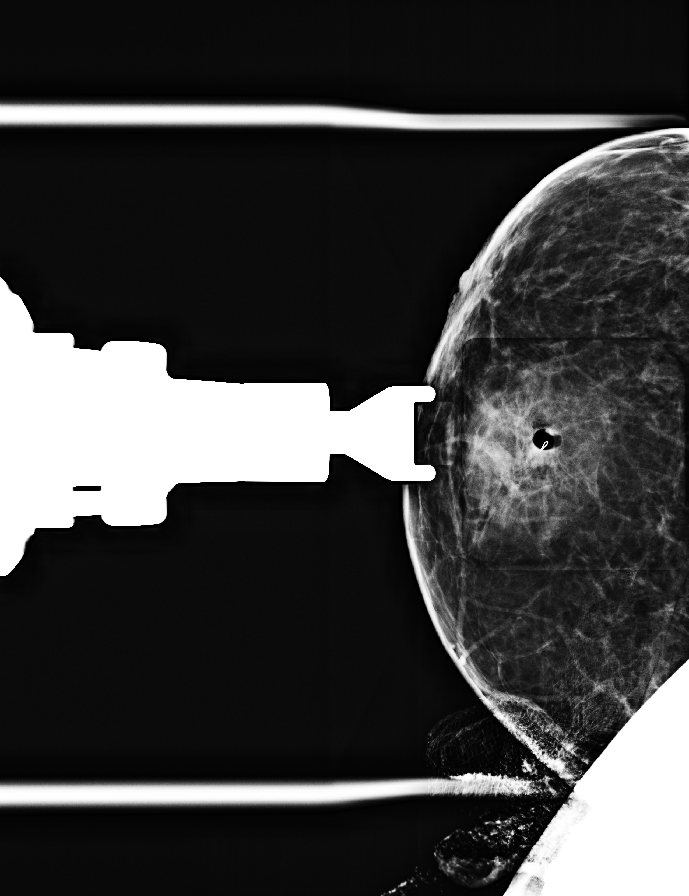

[L FB tomo · tomo slice 27/53.0]
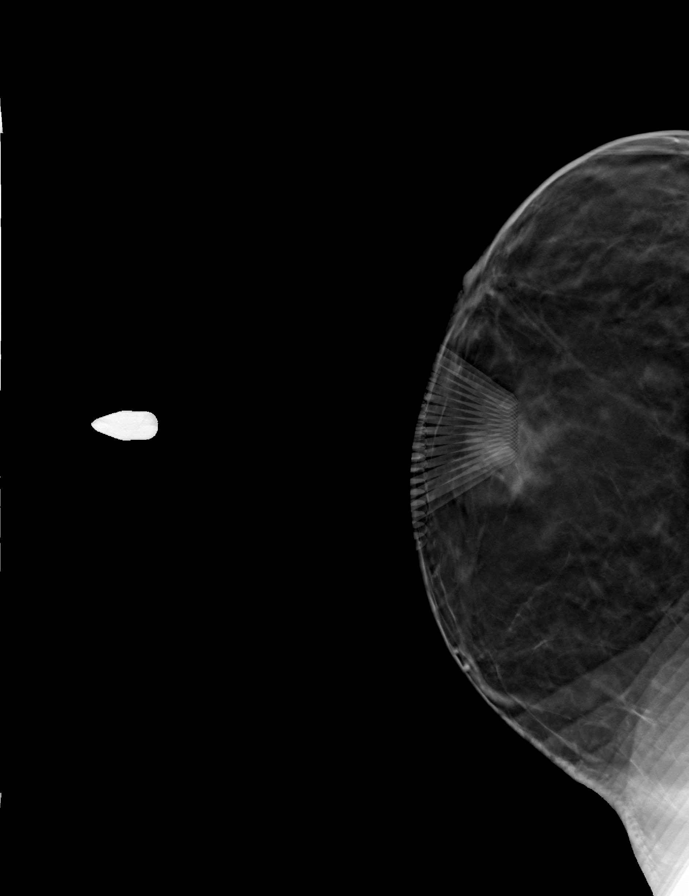

[8 of 22 positions shown; findings below may reference images not displayed]



Using sterile technique and 1% lidocaine and 1% lidocaine with
epinephrine as local anesthetic, under stereotactic guidance, a 9
gauge vacuum assisted device was used to perform core needle biopsy
of an asymmetry in the lower inner quadrant of the LEFT breast using
a inferior approach. Specimen radiograph was performed showing
representative tissue.

Lesion quadrant: Lower inner quadrant

At the conclusion of the procedure, a RIBBON shaped tissue marker
clip was deployed into the biopsy cavity. Follow-up 2-view mammogram
was performed and dictated separately.
IMPRESSION: Stereotactic-guided biopsy of a LEFT breast asymmetry. No
significant apparent complications. A small post biopsy hematoma was
noted at the site of biopsy.

ADDENDUM:
PATHOLOGY revealed: A. BREAST ASYMMETRY, LEFT LOWER INNER ANTERIOR;
STEREOTACTIC BIOPSY: - BENIGN BREAST TISSUE WITH PSEUDOANGIOMATOUS
STROMAL HYPERPLASIA, COLUMNAR CELL CHANGE, AND APOCRINE METAPLASIA.
- NEGATIVE FOR ATYPIA AND MALIGNANCY.

Pathology results are CONCORDANT with imaging findings, per Dr.
Rajhaun Pulchan.

Pathology results and recommendations were discussed with patient
via telephone on 02/27/2022. Patient reported biopsy site doing well
with no adverse symptoms, and only slight tenderness at the site.
Patient stated she is also taking antibiotics prescribed for her to
treat infection of RIGHT breast, and applying heat to the area of
infection. Post biopsy care instructions were reviewed, questions
were answered and my direct phone number was provided. Patient was
instructed to call [HOSPITAL] for any additional
questions or concerns related to biopsy site.

RECOMMENDATIONS: 1. Patient instructed to continue monthly self
breast examinations and resume annual bilateral screening mammogram
due January 2023.

2. Patient instructed to follow-up with provider to ensure
resolution RIGHT breast infection. Patient placed on antibiotic
therapy by radiologist on 02/26/2022 to treat RIGHT breast infection.

Pathology results reported by Soulsidibe Biuck RN on 02/27/2022.



Using sterile technique and 1% lidocaine and 1% lidocaine with
epinephrine as local anesthetic, under stereotactic guidance, a 9
gauge vacuum assisted device was used to perform core needle biopsy
of an asymmetry in the lower inner quadrant of the LEFT breast using
a inferior approach. Specimen radiograph was performed showing
representative tissue.

Lesion quadrant: Lower inner quadrant

At the conclusion of the procedure, a RIBBON shaped tissue marker
clip was deployed into the biopsy cavity. Follow-up 2-view mammogram
was performed and dictated separately.
IMPRESSION: Stereotactic-guided biopsy of a LEFT breast asymmetry. No
significant apparent complications. A small post biopsy hematoma was
noted at the site of biopsy.

## 2024-10-23 ENCOUNTER — Other Ambulatory Visit: Payer: Self-pay

## 2024-10-23 ENCOUNTER — Emergency Department
Admission: EM | Admit: 2024-10-23 | Discharge: 2024-10-23 | Disposition: A | Discharge status: Home / Self Care | Attending: Emergency Medicine | Admitting: Emergency Medicine

## 2024-10-23 DIAGNOSIS — R300 Dysuria: Secondary | ICD-10-CM | POA: Diagnosis present

## 2024-10-23 DIAGNOSIS — N390 Urinary tract infection, site not specified: Secondary | ICD-10-CM | POA: Insufficient documentation

## 2024-10-23 LAB — URINALYSIS, W/ REFLEX TO CULTURE (INFECTION SUSPECTED)
Bilirubin Urine: NEGATIVE
Glucose, UA: NEGATIVE mg/dL
Ketones, ur: NEGATIVE mg/dL
Nitrite: NEGATIVE
Protein, ur: 30 mg/dL — AB
Specific Gravity, Urine: 1.014 (ref 1.005–1.030)
WBC, UA: >50 WBC/hpf (ref 0–5)
pH: 5 (ref 5.0–8.0)

## 2024-10-23 LAB — POC URINE PREG, ED: Preg Test, Ur: NEGATIVE

## 2024-10-23 MED ORDER — CEPHALEXIN 500 MG PO CAPS: 500.0000 mg | ORAL_CAPSULE | Freq: Two times a day (BID) | ORAL | 0 refills | Status: AC

## 2024-10-23 MED ORDER — CEPHALEXIN 500 MG PO CAPS
500.0000 mg | ORAL_CAPSULE | Freq: Once | ORAL | Status: AC
  Administered 2024-10-23: 500 mg via ORAL
  Filled 2024-10-23: qty 1

## 2024-10-23 NOTE — ED Triage Notes (Signed)
 POV with CC of burning with urination and increased frequency for the last five days. Denies vomiting and diarrhea.

## 2024-10-23 NOTE — Discharge Instructions (Signed)
 Please take the antibiotics as prescribed for UTI.

## 2024-10-23 NOTE — ED Provider Notes (Signed)
 Michele Novak Provider Note    Event Date/Time   First MD Initiated Contact with Patient 10/23/24 0500     (approximate)   History   Dysuria   HPI  Michele Novak is a 43 y.o. female with history of depression, presenting with 3 days of dysuria and lower abdominal cramping.  No fever, no back pain, no nausea vomiting or diarrhea.  On independent review, she has no prior urine cultures to compare.  On independent chart review, she was seen by infectious disease for Tdap and MMR vaccines.     Physical Exam   Triage Vital Signs: ED Triage Vitals  Encounter Vitals Group     BP 10/23/24 0449 138/80     Girls Systolic BP Percentile --      Girls Diastolic BP Percentile --      Boys Systolic BP Percentile --      Boys Diastolic BP Percentile --      Pulse Rate 10/23/24 0449 73     Resp 10/23/24 0449 18     Temp 10/23/24 0449 98 F (36.7 C)     Temp Source 10/23/24 0449 Oral     SpO2 10/23/24 0449 100 %     Weight 10/23/24 0445 230 lb (104.3 kg)     Height 10/23/24 0445 5' 5 (1.651 m)     Head Circumference --      Peak Flow --      Pain Score 10/23/24 0445 7     Pain Loc --      Pain Education --      Exclude from Growth Chart --     Most recent vital signs: Vitals:   10/23/24 0449  BP: 138/80  Pulse: 73  Resp: 18  Temp: 98 F (36.7 C)  SpO2: 100%     General: Awake, no distress.  CV:  Good peripheral perfusion.  Resp:  Normal effort.  Abd:  No distention.  Soft nontender Other:  No CVA or flank tenderness bilaterally   ED Results / Procedures / Treatments   Labs (all labs ordered are listed, but only abnormal results are displayed) Labs Reviewed  URINALYSIS, W/ REFLEX TO CULTURE (INFECTION SUSPECTED) - Abnormal; Notable for the following components:      Result Value   Color, Urine YELLOW (*)    APPearance CLOUDY (*)    Hgb urine dipstick SMALL (*)    Protein, ur 30 (*)    Leukocytes,Ua LARGE (*)    Bacteria, UA RARE  (*)    All other components within normal limits  URINE CULTURE  POC URINE PREG, ED     PROCEDURES:  Critical Care performed: No  Procedures   MEDICATIONS ORDERED IN ED: Medications  cephALEXin (KEFLEX) capsule 500 mg (has no administration in time range)     IMPRESSION / MDM / ASSESSMENT AND PLAN / ED COURSE  I reviewed the triage vital signs and the nursing notes.                              Differential diagnosis includes, but is not limited to, UTI, cystitis, muscular pain, strain, bladder irritation.  UA, pregnancy test.  Patient's presentation is most consistent with acute illness / injury with system symptoms.  Independent interpretation of labs below.  UA is consistent with UTI, will give her first dose of antibiotics here and send her home with a prescription.  Instructed to  follow-up primary care next week to get reassessed.  Considered but no indication for inpatient admission at this time, she is safe for outpatient management.  Will discharge with strict return precautions.        FINAL CLINICAL IMPRESSION(S) / ED DIAGNOSES   Final diagnoses:  Dysuria  Urinary tract infection without hematuria, site unspecified     Rx / DC Orders   ED Discharge Orders          Ordered    cephALEXin (KEFLEX) 500 MG capsule  2 times daily        10/23/24 9472             Note:  This document was prepared using Dragon voice recognition software and may include unintentional dictation errors.    Waymond Lorelle Cummins, MD 10/23/24 [phone]

## 2024-10-25 LAB — URINE CULTURE: Culture: 60000 — AB

## 2024-10-26 NOTE — Progress Notes (Signed)
 ED Antimicrobial Stewardship Positive Culture Follow Up   Michele Novak is an 43 y.o. female who presented to [facility] on 10/23/2024 with a chief complaint of  Chief Complaint  Patient presents with   Dysuria    Recent Results (from the past 720 hours)  Urine Culture     Status: Abnormal   Collection Time: 10/23/24  4:50 AM   Specimen: Urine, Random  Result Value Ref Range Status   Specimen Description   Final    URINE, RANDOM Performed at [facility], [address], [place], [place] [address]    Special Requests   Final    NONE Reflexed from [id number] Performed at [facility], [address] [place], [place] [address]    Culture 60,000 COLONIES/mL STAPHYLOCOCCUS SAPROPHYTICUS (A)  Final   Report Status 10/25/2024 FINAL  Final   Organism ID, Bacteria STAPHYLOCOCCUS SAPROPHYTICUS (A)  Final      Susceptibility   Staphylococcus saprophyticus - MIC*    CIPROFLOXACIN <=0.5 SENSITIVE Sensitive     GENTAMICIN <=0.5 SENSITIVE Sensitive     NITROFURANTOIN <=16 SENSITIVE Sensitive     OXACILLIN 2 RESISTANT Resistant     TETRACYCLINE <=1 SENSITIVE Sensitive     VANCOMYCIN 1 SENSITIVE Sensitive     TRIMETH /SULFA  <=10 SENSITIVE Sensitive     RIFAMPIN <=0.5 SENSITIVE Sensitive     Inducible Clindamycin NEGATIVE Sensitive     * 60,000 COLONIES/mL STAPHYLOCOCCUS SAPROPHYTICUS    [x]  Treated with Cephalexin, organism resistant to prescribed antimicrobial   New antibiotic prescription: Bactrim  1 DS BID x 3 days  ED Provider: Dr. Jossie   Patient is a 43 yo F who presented to the ED with burning w/ urination, and increased urinary frequency x 5 days. No fever or flank pain. Patient was discharged on Cephalexin. Urine culture is now growing Staphylococcus saprophyticus, which is oxacillin resistant. Cephalexin would not cover the organism. Discussed with ED provider and will switch the patient to Bactrim  1 DS BID x 3 days.   Called patient and informed about the  change in antibiotics. Will send prescription to [facility].    Ransom Blanch PGY-1 Pharmacy Resident  [facility]  10/26/2024 4:50 PM
